# Patient Record
Sex: Male | Born: 1995 | Race: Black or African American | Hispanic: No | Marital: Single | Smoking: Never smoker
Health system: Southern US, Community
[De-identification: ages and names within clinical notes are randomized; demographics above are authoritative.]

## PROBLEM LIST (undated history)

## (undated) DIAGNOSIS — T7840XA Allergy, unspecified, initial encounter: Secondary | ICD-10-CM

## (undated) DIAGNOSIS — B019 Varicella without complication: Secondary | ICD-10-CM

## (undated) DIAGNOSIS — B029 Zoster without complications: Secondary | ICD-10-CM

## (undated) HISTORY — DX: Allergy, unspecified, initial encounter: T78.40XA

## (undated) HISTORY — PX: TONSILLECTOMY: SUR1361

---
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
## 2013-09-05 ENCOUNTER — Emergency Department: Payer: Self-pay | Admitting: Emergency Medicine

## 2014-07-08 ENCOUNTER — Emergency Department: Payer: Self-pay | Admitting: Emergency Medicine

## 2015-01-04 ENCOUNTER — Encounter: Payer: Self-pay | Admitting: Family Medicine

## 2015-01-04 ENCOUNTER — Ambulatory Visit (INDEPENDENT_AMBULATORY_CARE_PROVIDER_SITE_OTHER): Payer: BLUE CROSS/BLUE SHIELD | Admitting: Family Medicine

## 2015-01-04 VITALS — BP 108/82 | HR 67 | Temp 98.6°F | Resp 16 | Ht 70.5 in | Wt 154.6 lb

## 2015-01-04 DIAGNOSIS — M79671 Pain in right foot: Secondary | ICD-10-CM

## 2015-01-04 DIAGNOSIS — M25571 Pain in right ankle and joints of right foot: Secondary | ICD-10-CM | POA: Diagnosis not present

## 2015-01-04 DIAGNOSIS — Z23 Encounter for immunization: Secondary | ICD-10-CM | POA: Insufficient documentation

## 2015-01-04 NOTE — Progress Notes (Signed)
Name: Alan Pugh   MRN: [id number]    DOB: 01-Feb-1996   Date:01/04/2015       Progress Note  Subjective  Chief Complaint  Chief Complaint  Patient presents with  . Establish Care  . Foot Pain    patient plays basketball and has noticed some pain near the right ankle for the past 2 months. Patient has not noticed any reddness, warmth nor swelling.  Patient has not tried any medications for pain.    HPI  Joint/Muscle Pain: Patient complains of ankle and foot pain for which has been present for several months. Pain is located in right ankle and foot, is described as aching and shooting, and is intermittent, mild .  Associated symptoms include: none.  The patient has tried nothing for pain relief.  Related to injury:   No. Noticed while playing pick up basket ball about 3x/week but it does not inhibit him from playing. No instability, no previous injury.    Social History  Substance Use Topics  . Smoking status: Never Smoker   . Smokeless tobacco: Not on file  . Alcohol Use: No    No current outpatient prescriptions on file.  History reviewed. No pertinent past surgical history.  History reviewed. No pertinent family history.  No Known Allergies   Review of Systems  CONSTITUTIONAL: No significant weight changes, fever, chills, weakness or fatigue.  HEENT:  - Eyes: No visual changes.  - Ears: No auditory changes. No pain.  - Nose: No sneezing, congestion, runny nose. - Throat: No sore throat. No changes in swallowing. SKIN: No rash or itching.  CARDIOVASCULAR: No chest pain, chest pressure or chest discomfort. No palpitations or edema.  RESPIRATORY: No shortness of breath, cough or sputum.  GASTROINTESTINAL: No anorexia, nausea, vomiting. No changes in bowel habits. No abdominal pain or blood.  GENITOURINARY: No dysuria. No frequency. No discharge. NEUROLOGICAL: No headache, dizziness, syncope, paralysis, ataxia, numbness or tingling in the extremities. No memory  changes. No change in bowel or bladder control.  MUSCULOSKELETAL: Yes ankle and foot joint pain. No muscle pain. HEMATOLOGIC: No anemia, bleeding or bruising.  LYMPHATICS: No enlarged lymph nodes.  PSYCHIATRIC: No change in mood. No change in sleep pattern.  ENDOCRINOLOGIC: No reports of sweating, cold or heat intolerance. No polyuria or polydipsia.     Objective  BP 108/82 mmHg  Pulse 67  Temp(Src) 98.6 F (37 C) (Oral)  Resp 16  Ht 5' 10.5" (1.791 m)  Wt 154 lb 9.6 oz (70.126 kg)  BMI 21.86 kg/m2  SpO2 98% Body mass index is 21.86 kg/(m^2).  Physical Exam  Constitutional: Patient appears well-developed and well-nourished. In no distress.  Neck: Normal range of motion. Neck supple. No JVD present. No thyromegaly present.  Cardiovascular: Normal rate, regular rhythm and normal heart sounds.  No murmur heard.  Pulmonary/Chest: Effort normal and breath sounds normal. No respiratory distress. Musculoskeletal: Normal range of motion bilateral UE and LE, no joint effusions.  Bilateral ankles and feet examined with no significant findings.   Peripheral vascular: Bilateral LE no edema. Neurological: CN II-XII grossly intact with no focal deficits. Alert and oriented to person, place, and time. Coordination, balance, strength, speech and gait are normal.  Skin: Skin is warm and dry. No rash noted. No erythema.  Psychiatric: Patient has a normal mood and affect. Behavior is normal in office today. Judgment and thought content normal in office today.   Assessment & Plan  1. Right foot pain No concerning findings, likely  laxity in ankle joint but will get x-ray to rule out bone abnormality.  Given instructions on home care, stretching, NSAID as needed, ice as needed.   - DG Foot Complete Right; Future  2. Right ankle pain No concerning findings, likely laxity in ankle joint but will get x-ray to rule out bone abnormality. Given instructions on home care, stretching, NSAID as needed,  ice as needed.    - DG Ankle Complete Right; Future  3. Need for immunization against influenza  - Flu Vaccine QUAD 36+ mos PF IM (Fluarix Quad PF)

## 2015-01-29 ENCOUNTER — Encounter: Payer: BLUE CROSS/BLUE SHIELD | Admitting: Family Medicine

## 2015-02-16 ENCOUNTER — Emergency Department
Admission: EM | Admit: 2015-02-16 | Discharge: 2015-02-16 | Disposition: A | Discharge status: Home / Self Care | Payer: BLUE CROSS/BLUE SHIELD | Attending: Emergency Medicine | Admitting: Emergency Medicine

## 2015-02-16 ENCOUNTER — Encounter: Payer: Self-pay | Admitting: Emergency Medicine

## 2015-02-16 DIAGNOSIS — L259 Unspecified contact dermatitis, unspecified cause: Secondary | ICD-10-CM

## 2015-02-16 DIAGNOSIS — R21 Rash and other nonspecific skin eruption: Secondary | ICD-10-CM | POA: Diagnosis present

## 2015-02-16 HISTORY — DX: Zoster without complications: B02.9

## 2015-02-16 HISTORY — DX: Varicella without complication: B01.9

## 2015-02-16 MED ORDER — CETIRIZINE HCL 10 MG PO TABS: 10.0000 mg | ORAL_TABLET | Freq: Every day | ORAL | Status: DC

## 2015-02-16 MED ORDER — TRIAMCINOLONE ACETONIDE 0.05 % EX OINT: 1.0000 "application " | TOPICAL_OINTMENT | Freq: Four times a day (QID) | CUTANEOUS | Status: DC

## 2015-02-16 NOTE — ED Provider Notes (Signed)
[facility] Emergency Department [facility] Note  ____________________________________________  Time seen: Approximately 7:13 AM  I have reviewed the triage vital signs and the nursing notes.   HISTORY  Chief Complaint Rash    HPI Alan Pugh is a 19 y.o. male this emergency department complaining of a rash 4 days. He states that the majority of the rash is located on the back of his right leg. He does endorse a rash on his left hip, left abdomen, and left axillary region. The rash is pruritic and described as red bumps. He denies any blistering, lesions, drainage, scabbing. No ocular or oral involvement. No shortness of breath. He reports that it is painful" after a scratching a lot." No other symptoms or complaints.   Past Medical History  Diagnosis Date  . 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 Shingles   . Chicken pox     Patient Active Problem List   Diagnosis Date Noted  . Right foot pain 01/04/2015  . Right ankle pain 01/04/2015  . Need for immunization against influenza 01/04/2015    History reviewed. No pertinent past surgical history.  Current Outpatient Rx  Name  Route  Sig  Dispense  Refill  . cetirizine (ZYRTEC) 10 MG tablet   Oral   Take 1 tablet (10 mg total) by mouth daily.   30 tablet   0   . TRIAMCINOLONE ACETONIDE, TOP, 0.05 % OINT   Apply externally   Apply 1 application topically 4 (four) times daily.   15 g   0     Allergies Review of patient's allergies indicates no known allergies.  No family history on file.  Social History Social History  Substance Use Topics  . Smoking status: Never Smoker   . Smokeless tobacco: None  . Alcohol Use: No    Review of Systems Constitutional: No fever/chills Eyes: No visual changes. ENT: No sore throat. Cardiovascular: Denies chest pain. Respiratory: Denies shortness of breath. Gastrointestinal: No abdominal pain.  No nausea, no vomiting.  No diarrhea.   No constipation. Genitourinary: Negative for dysuria. Musculoskeletal: Negative for back pain. Skin: Positive for rash. Neurological: Negative for headaches, focal weakness or numbness.  10-point ROS otherwise negative.  ____________________________________________   PHYSICAL EXAM:  VITAL SIGNS: ED Triage Vitals  Enc Vitals Group     BP 02/16/15 0710 123/78 mmHg     Pulse Rate 02/16/15 0710 60     Resp 02/16/15 0710 18     Temp 02/16/15 0710 98.2 F (36.8 C)     Temp src --      SpO2 02/16/15 0710 97 %     Weight 02/16/15 0710 170 lb (77.111 kg)     Height 02/16/15 0710  (1.778 m)     Head Cir --      Peak Flow --      Pain Score 02/16/15 0711 5     Pain Loc --      Pain Edu? --      Excl. in GC? --     Constitutional: Alert and oriented. Well appearing and in no acute distress. Eyes: Conjunctivae are normal. PERRL. EOMI. Head: Atraumatic. Nose: No congestion/rhinnorhea. Mouth/Throat: Mucous membranes are moist.  Oropharynx non-erythematous. Neck: No stridor.   Cardiovascular: Normal rate, regular rhythm. Grossly normal heart sounds.  Good peripheral circulation. Respiratory: Normal respiratory effort.  No retractions. Lungs CTAB. Gastrointestinal: Soft and nontender. No distention. No abdominal bruits. No CVA tenderness. Musculoskeletal: No  lower extremity tenderness nor edema.  No joint effusions. Neurologic:  Normal speech and language. No gross focal neurologic deficits are appreciated. No gait instability. Skin:  Skin is warm, dry and intact. Maculopapular rash noted to popliteal fossa area, left lateral hip, left lateral abdomen, left axillary region. Rash is erythematous. No vesicular lesions noted. No scabbing. The rash does not appear to be dermatomal in nature. Psychiatric: Mood and affect are normal. Speech and behavior are normal.  ____________________________________________   LABS (all labs ordered are listed, but only abnormal results are  displayed)  Labs Reviewed - No data to display ____________________________________________  EKG   ____________________________________________  RADIOLOGY   ____________________________________________   PROCEDURES  Procedure(s) performed: None  Critical Care performed: No  ____________________________________________   INITIAL IMPRESSION / ASSESSMENT AND PLAN / ED COURSE  Pertinent labs & imaging results that were available during my care of the patient were reviewed by me and considered in my medical decision making (see chart for details).  Patient is a 19 year old male who presents to the emergency department complaining of a rash 4 days. He endorses purulent rhinitis and "pain after scratching the area." The patient has a history of shingles and is concerned that this is a repeat of same. He advises that the outbreak is different than his previous operations. He has been present for 4 days. The patient's history, symptoms, and exam is not consistent with shingles and most consistent with contact dermatitis. Explained diagnosis to patient. He verbalized understanding. Discussed treatment plan with patient and he verbalized understanding of same. ____________________________________________   FINAL CLINICAL IMPRESSION(S) / ED DIAGNOSES  Final diagnoses:  Contact dermatitis      Racheal Patches, PA-C 02/16/15 2956  Arnaldo Natal, MD 02/16/15 [phone]

## 2015-02-16 NOTE — Discharge Instructions (Signed)

## 2015-02-16 NOTE — ED Notes (Signed)
Patient to ER for c/o rash down right leg. States it looks like previous shingle rash. Denies any drainage at this time.

## 2016-02-14 ENCOUNTER — Emergency Department
Admission: EM | Admit: 2016-02-14 | Discharge: 2016-02-14 | Disposition: A | Discharge status: Home / Self Care | Payer: BLUE CROSS/BLUE SHIELD | Attending: Emergency Medicine | Admitting: Emergency Medicine

## 2016-02-14 ENCOUNTER — Emergency Department: Payer: BLUE CROSS/BLUE SHIELD

## 2016-02-14 ENCOUNTER — Encounter: Payer: Self-pay | Admitting: Emergency Medicine

## 2016-02-14 DIAGNOSIS — S60111A Contusion of right thumb with damage to nail, initial encounter: Secondary | ICD-10-CM | POA: Diagnosis not present

## 2016-02-14 DIAGNOSIS — Y92009 Unspecified place in unspecified non-institutional (private) residence as the place of occurrence of the external cause: Secondary | ICD-10-CM | POA: Insufficient documentation

## 2016-02-14 DIAGNOSIS — Y9367 Activity, basketball: Secondary | ICD-10-CM | POA: Insufficient documentation

## 2016-02-14 DIAGNOSIS — S6991XA Unspecified injury of right wrist, hand and finger(s), initial encounter: Secondary | ICD-10-CM | POA: Diagnosis present

## 2016-02-14 DIAGNOSIS — W010XXA Fall on same level from slipping, tripping and stumbling without subsequent striking against object, initial encounter: Secondary | ICD-10-CM | POA: Insufficient documentation

## 2016-02-14 DIAGNOSIS — Y998 Other external cause status: Secondary | ICD-10-CM | POA: Diagnosis not present

## 2016-02-14 DIAGNOSIS — S60011A Contusion of right thumb without damage to nail, initial encounter: Secondary | ICD-10-CM

## 2016-02-14 MED ORDER — NAPROXEN 500 MG PO TABS: 500.0000 mg | ORAL_TABLET | Freq: Two times a day (BID) | ORAL | 0 refills | Status: DC

## 2016-02-14 NOTE — ED Notes (Signed)
Patient to radiology at this time for ordered film of RIGHT thumb.

## 2016-02-14 NOTE — ED Notes (Signed)
FIRST NURSE NOTE: Patient presents with c/o RIGHT thumb pain. Patient states, "I think that I sprained it". Patient with pain and swelling s/p fall. Film ordered prior to triage.

## 2016-02-14 NOTE — ED Triage Notes (Signed)
Pt to ED via POV for RT 1st digit injury. Pt states he fell on his hand at home, pt has notable swelling and bleeding under nail bed. Pt unable to bend finger. Pt denies any other injuries.

## 2016-02-14 NOTE — ED Notes (Signed)
See triage note.

## 2016-02-14 NOTE — ED Provider Notes (Signed)
[facility] Emergency Department Provider Note  ____________________________________________  Time seen: Approximately 8:27 PM  I have reviewed the triage vital signs and the nursing notes.   HISTORY  Chief Complaint Finger Injury    HPI Alan MillerJustin R Pugh is a 20 y.o. male , NAD, presents to the emergency department accompanied by his mother who assists with history. Patient states that he tripped over a table and fell onto his right hand injuring his thumb. States that he has had swelling about the thumb since that time. Has not taken any medications for pain nor completed supportive care. Has not noted any open wounds or bleeding. No previous injuries to this hand or fingers. Denies any numbness, weakness, tingling. Denies head injury, LOC, visual changes to cause the fall. Has no neck or back pain.   Past Medical History:  Diagnosis Date  . 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 Chicken pox   . Shingles     Patient Active Problem List   Diagnosis Date Noted  . Right foot pain 01/04/2015  . Right ankle pain 01/04/2015  . Need for immunization against influenza 01/04/2015    Past Surgical History:  Procedure Laterality Date  . TONSILLECTOMY      Prior to Admission medications   Medication Sig Start Date End Date Taking? Authorizing Provider  cetirizine (ZYRTEC) 10 MG tablet Take 1 tablet (10 mg total) by mouth daily. 02/16/15   Delorise RoyalsJonathan D Cuthriell, PA-C  naproxen (NAPROSYN) 500 MG tablet Take 1 tablet (500 mg total) by mouth 2 (two) times daily with a meal. 02/14/16   Shellia Hartl L Bryten Maher, PA-C  TRIAMCINOLONE ACETONIDE, TOP, 0.05 % OINT Apply 1 application topically 4 (four) times daily. 02/16/15   Delorise RoyalsJonathan D Cuthriell, PA-C    Allergies Review of patient's allergies indicates no known allergies.  No family history on file.  Social History Social History  Substance Use Topics  . Smoking status: Never Smoker  . Smokeless tobacco: Never Used   . Alcohol use No     Review of Systems  Constitutional: No Fatigue Eyes: No visual changes.  Musculoskeletal: Positive right thumb pain. Negative for hand, back, Neck pain.  Skin: Positive swelling right thumb. Positive bruising right thumbnail. Negative for rash, open wounds. Neurological: Negative for numbness, weakness, tingling. No LOC, lightheadedness. 10-point ROS otherwise negative.  ____________________________________________   PHYSICAL EXAM:  VITAL SIGNS: ED Triage Vitals  Enc Vitals Group     BP 02/14/16 1959 115/64     Pulse Rate 02/14/16 1959 91     Resp 02/14/16 1959 16     Temp 02/14/16 1959 98.4 F (36.9 C)     Temp Source 02/14/16 1959 Oral     SpO2 02/14/16 1959 95 %     Weight 02/14/16 2000 165 lb (74.8 kg)     Height 02/14/16 2000 5\' 10"  (1.778 m)     Head Circumference --      Peak Flow --      Pain Score 02/14/16 2001 3     Pain Loc --      Pain Edu? --      Excl. in GC? --      Constitutional: Alert and oriented. Well appearing and in no acute distress. Eyes: Conjunctivae are normal.  Head: Atraumatic. Cardiovascular: Good peripheral circulation with 2+ pulses noted in the right upper extremity. Capillary refill is brisk in all digits of the right hand. Respiratory: Normal respiratory effort without tachypnea or retractions.  Musculoskeletal: Tenderness to palpation diffusely about the right thumb without joint swelling or crepitus. Full passive range of motion of the right thumb with moderate pain. Neurologic:  Normal speech and language. No gross focal neurologic deficits are appreciated.  Skin:  Skin is warm, dry and intact. No rash noted. Subungual hematoma noted about the right thumb. No open wounds or skin sores are noted. No active bleeding, oozing or weeping. Psychiatric: Mood and affect are normal. Speech and behavior are normal for age   ____________________________________________    LABS  None ____________________________________________  EKG  None ____________________________________________  RADIOLOGY I have personally viewed and evaluated these images (plain radiographs) as part of my medical decision making, as well as reviewing the written report by the radiologist.  Dg Finger Thumb Right  Result Date: 02/14/2016 CLINICAL DATA:  20 year old who fell and injured the right thumb earlier today while playing basketball. Swelling and bruising under the nail. Initial encounter. EXAM: RIGHT THUMB 2+V COMPARISON:  None. FINDINGS: No evidence of acute fracture or dislocation. Joint spaces well preserved. Well-preserved bone mineral density. No intrinsic osseous abnormalities. IMPRESSION: Normal examination. Electronically Signed   By: Hulan Saas M.D.   On: 02/14/2016 19:46    ____________________________________________    PROCEDURES  Procedure(s) performed: None   Procedures   Medications - No data to display   ____________________________________________   INITIAL IMPRESSION / ASSESSMENT AND PLAN / ED COURSE  Pertinent labs & imaging results that were available during my care of the patient were reviewed by me and considered in my medical decision making (see chart for details).  Clinical Course    Patient's diagnosis is consistent with Contusion of right thumb with contusion of the nail. Patient will be discharged home with prescriptions for naproxen to take as directed. Patient is to apply ice to the affected area 20 minutes 3-4 times daily and complete range of motion exercises as tolerated. Patient is to follow up with his primary care provider or [facility] if symptoms persist past this treatment course. Patient is given ED precautions to return to the ED for any worsening or new symptoms.    ____________________________________________  FINAL CLINICAL IMPRESSION(S) / ED DIAGNOSES  Final diagnoses:  Contusion of right  thumb, initial encounter  Contusion of right thumb nail, initial encounter      NEW MEDICATIONS STARTED DURING THIS VISIT:  Discharge Medication List as of 02/14/2016  8:32 PM    START taking these medications   Details  naproxen (NAPROSYN) 500 MG tablet Take 1 tablet (500 mg total) by mouth 2 (two) times daily with a meal., Starting Thu 02/14/2016, Print             Ernestene Kiel Montrose, PA-C 02/14/16 2149    Emily Filbert, MD 02/14/16 2227

## 2017-02-09 ENCOUNTER — Encounter: Payer: Self-pay | Admitting: Emergency Medicine

## 2017-02-09 ENCOUNTER — Emergency Department
Admission: EM | Admit: 2017-02-09 | Discharge: 2017-02-10 | Disposition: A | Discharge status: Home / Self Care | Payer: BLUE CROSS/BLUE SHIELD | Attending: Emergency Medicine | Admitting: Emergency Medicine

## 2017-02-09 DIAGNOSIS — R1012 Left upper quadrant pain: Secondary | ICD-10-CM | POA: Diagnosis present

## 2017-02-09 DIAGNOSIS — K2900 Acute gastritis without bleeding: Secondary | ICD-10-CM

## 2017-02-09 LAB — CBC
HEMATOCRIT: 42.7 % (ref 40.0–52.0)
Hemoglobin: 14.1 g/dL (ref 13.0–18.0)
MCH: 25.8 pg — AB (ref 26.0–34.0)
MCHC: 32.9 g/dL (ref 32.0–36.0)
MCV: 78.5 fL — AB (ref 80.0–100.0)
Platelets: 145 10*3/uL — ABNORMAL LOW (ref 150–440)
RBC: 5.45 MIL/uL (ref 4.40–5.90)
RDW: 15.6 % — AB (ref 11.5–14.5)
WBC: 4.6 10*3/uL (ref 3.8–10.6)

## 2017-02-09 LAB — COMPREHENSIVE METABOLIC PANEL
ALT: 28 U/L (ref 17–63)
AST: 35 U/L (ref 15–41)
Albumin: 4.3 g/dL (ref 3.5–5.0)
Alkaline Phosphatase: 63 U/L (ref 38–126)
Anion gap: 7 (ref 5–15)
BILIRUBIN TOTAL: 0.9 mg/dL (ref 0.3–1.2)
BUN: 12 mg/dL (ref 6–20)
CHLORIDE: 107 mmol/L (ref 101–111)
CO2: 26 mmol/L (ref 22–32)
Calcium: 9.2 mg/dL (ref 8.9–10.3)
Creatinine, Ser: 1.01 mg/dL (ref 0.61–1.24)
GFR calc Af Amer: >60 mL/min (ref >60)
GFR calc non Af Amer: >60 mL/min (ref >60)
GLUCOSE: 100 mg/dL — AB (ref 65–99)
POTASSIUM: 3.9 mmol/L (ref 3.5–5.1)
Sodium: 140 mmol/L (ref 135–145)
Total Protein: 7.5 g/dL (ref 6.5–8.1)

## 2017-02-09 LAB — LIPASE, BLOOD: Lipase: 33 U/L (ref 11–51)

## 2017-02-09 MED ORDER — SUCRALFATE 1 G PO TABS
1.0000 g | ORAL_TABLET | Freq: Once | ORAL | Status: AC
  Administered 2017-02-09: 1 g via ORAL
  Filled 2017-02-09: qty 1

## 2017-02-09 MED ORDER — SODIUM CHLORIDE 0.9 % IV BOLUS (SEPSIS)
1000.0000 mL | Freq: Once | INTRAVENOUS | Status: AC
  Administered 2017-02-09: 1000 mL via INTRAVENOUS

## 2017-02-09 MED ORDER — GI COCKTAIL ~~LOC~~
30.0000 mL | Freq: Once | ORAL | Status: AC
  Administered 2017-02-09: 30 mL via ORAL
  Filled 2017-02-09: qty 30

## 2017-02-09 NOTE — ED Triage Notes (Signed)
Patient presents to ED via POV from home with c/o generalized abdominal pain that began today. Patient moaning and guarding him abdomen in triage. Denies N/V/D.

## 2017-02-10 MED ORDER — FAMOTIDINE 40 MG PO TABS: 40.0000 mg | ORAL_TABLET | Freq: Every evening | ORAL | 0 refills | Status: DC

## 2017-02-10 MED ORDER — SUCRALFATE 1 G PO TABS: 1.0000 g | ORAL_TABLET | Freq: Two times a day (BID) | ORAL | 0 refills | Status: DC

## 2017-02-10 NOTE — ED Provider Notes (Signed)
[facility] Emergency Department Provider Note   ____________________________________________   First MD Initiated Contact with Patient 02/09/17 2332     (approximate)  I have reviewed the triage vital signs and the nursing notes.   HISTORY  Chief Complaint Abdominal Pain    HPI Alan Pugh is a 21 y.o. male who comes into the hospital today with some worsening abdominal pain. He reports it was an acute onset today. He came home from work around 4:30 to 5 PM and his abdomen started hurting. He states it was his mid abdomen and it worsened as the evening went on. The patient states that the pain moves to pending on what side he lays on and is worse when he lays down. The patient states he ate breakfast this morning and then after he came home he tried to eat some chicken strips and french fries. The patient was able to eat but did feel sick. He states the pain is deep and sharp and intermittent. Currently the patient's pain is a 5 out of 10 in intensity. He's had no vomiting or diarrhea. His last bowel movement was this morning. He's had some nausea and the urge to defecate.the patient has never had symptoms like this before. He did not take anything for pain home. He is here today for evaluation.   Past Medical History:  Diagnosis Date  . 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 Chicken pox   . Shingles     Patient Active Problem List   Diagnosis Date Noted  . Right foot pain 01/04/2015  . Right ankle pain 01/04/2015  . Need for immunization against influenza 01/04/2015    Past Surgical History:  Procedure Laterality Date  . TONSILLECTOMY      Prior to Admission medications   Medication Sig Start Date End Date Taking? Authorizing Provider  cetirizine (ZYRTEC) 10 MG tablet Take 1 tablet (10 mg total) by mouth daily. 02/16/15   Cuthriell, Delorise Royals, PA-C  famotidine (PEPCID) 40 MG tablet Take 1 tablet (40 mg total) by mouth every  evening. 02/10/17 02/10/18  Rebecka Apley, MD  naproxen (NAPROSYN) 500 MG tablet Take 1 tablet (500 mg total) by mouth 2 (two) times daily with a meal. 02/14/16   Hagler, Jami L, PA-C  sucralfate (CARAFATE) 1 g tablet Take 1 tablet (1 g total) by mouth 2 (two) times daily. 02/10/17   Rebecka Apley, MD  TRIAMCINOLONE ACETONIDE, TOP, 0.05 % OINT Apply 1 application topically 4 (four) times daily. 02/16/15   Cuthriell, Delorise Royals, PA-C    Allergies Patient has no known allergies.  No family history on file.  Social History Social History  Substance Use Topics  . Smoking status: Never Smoker  . Smokeless tobacco: Never Used  . Alcohol use No    Review of Systems  Constitutional: No fever/chills Eyes: No visual changes. ENT: No sore throat. Cardiovascular: Denies chest pain. Respiratory: Denies shortness of breath. Gastrointestinal:  abdominal pain.   nausea, no vomiting.  No diarrhea.  No constipation. Genitourinary: Negative for dysuria. Musculoskeletal: Negative for back pain. Skin: Negative for rash. Neurological: Negative for headaches, focal weakness or numbness.   ____________________________________________   PHYSICAL EXAM:  VITAL SIGNS: ED Triage Vitals  Enc Vitals Group     BP 02/09/17 2225 (!) 144/92     Pulse Rate 02/09/17 2225 (!) 57     Resp 02/09/17 2225 15     Temp 02/09/17 2225 98.7 F (  37.1 C)     Temp Source 02/09/17 2225 Oral     SpO2 02/09/17 2225 98 %     Weight 02/09/17 2225 165 lb (74.8 kg)     Height 02/09/17 2225  (1.803 m)     Head Circumference --      Peak Flow --      Pain Score 02/09/17 2224 7     Pain Loc --      Pain Edu? --      Excl. in GC? --     Constitutional: Alert and oriented. Well appearing and in moderate distress. Eyes: Conjunctivae are normal. PERRL. EOMI. Head: Atraumatic. Nose: No congestion/rhinnorhea. Mouth/Throat: Mucous membranes are moist.  Oropharynx non-erythematous. Cardiovascular: Normal rate,  regular rhythm. Grossly normal heart sounds.  Good peripheral circulation. Respiratory: Normal respiratory effort.  No retractions. Lungs CTAB. Gastrointestinal: Soft with some epigastric and left upper quadrant tenderness to palpation. No distention. positive bowel sounds Musculoskeletal: No lower extremity tenderness nor edema.   Neurologic:  Normal speech and language.  Skin:  Skin is warm, dry and intact. Marland Kitchen Psychiatric: Mood and affect are normal.   ____________________________________________   LABS (all labs ordered are listed, but only abnormal results are displayed)  Labs Reviewed  COMPREHENSIVE METABOLIC PANEL - Abnormal; Notable for the following:       Result Value   Glucose, Bld 100 (*)    All other components within normal limits  CBC - Abnormal; Notable for the following:    MCV 78.5 (*)    MCH 25.8 (*)    RDW 15.6 (*)    Platelets 145 (*)    All other components within normal limits  LIPASE, BLOOD  URINALYSIS, COMPLETE (UACMP) WITH MICROSCOPIC   ____________________________________________  EKG  none ____________________________________________  RADIOLOGY  No results found.  ____________________________________________   PROCEDURES  Procedure(s) performed: None  Procedures  Critical Care performed: No  ____________________________________________   INITIAL IMPRESSION / ASSESSMENT AND PLAN / ED COURSE  Pertinent labs & imaging results that were available during my care of the patient were reviewed by me and considered in my medical decision making (see chart for details).  this is a 21 year old male who comes into the hospital today with some abdominal pain. The pain started earlier today and has persisted. My differential diagnosis includes pancreatitis, gastritis, biliary colic versus cholecystitis. I did check some blood work on the patient and his lipase is unremarkable. The patient's blood work is unremarkable as well. I was more concerned  about gastritis given the fact that the patient did not have any right-sided abdominal pain and his blood work is unremarkable. I gave the patient liter of normal saline as well as a GI cocktail and Carafate. The patient states that after the medication his pain improved tremendously. I encouraged the patient to change his diet from fried fatty foods as well as cutting out tomato sauces, spicy foods and acidic foods. The patient should follow-up with his primary care physician for further evaluation. The patient has no further questions or concerns. He should return with any worsening condition or any other concerns.      ____________________________________________   FINAL CLINICAL IMPRESSION(S) / ED DIAGNOSES  Final diagnoses:  Acute gastritis without hemorrhage, unspecified gastritis type  Left upper quadrant pain      NEW MEDICATIONS STARTED DURING THIS VISIT:  Discharge Medication List as of 02/10/2017 12:43 AM    START taking these medications   Details  famotidine (PEPCID) 40 MG  tablet Take 1 tablet (40 mg total) by mouth every evening., Starting Tue 02/10/2017, Until Wed 02/10/2018, Print    sucralfate (CARAFATE) 1 g tablet Take 1 tablet (1 g total) by mouth 2 (two) times daily., Starting Tue 02/10/2017, Print         Note:  This document was prepared using Dragon voice recognition software and may include unintentional dictation errors.    Rebecka Apley, MD 02/10/17 [id number]

## 2017-02-10 NOTE — ED Notes (Signed)
Pt reports decrease in pain. Fluids infusing.

## 2017-02-10 NOTE — ED Notes (Signed)
Patient is resting comfortably. 

## 2017-02-10 NOTE — Discharge Instructions (Signed)
Please follow up with your primary care physician. Please refrain from eating greasy, spicy, acidic, tomato based foods and drinks. Please return with any worsening symptoms or any other concerns.

## 2017-02-10 NOTE — ED Notes (Signed)
Family at bedside. 

## 2017-02-25 IMAGING — CR DG FINGER THUMB 2+V*R*
1 series · 3 of 3 positions shown · non-contrast
Comparison: None.

CLINICAL DATA: 19-year-old who fell and injured the right thumb
earlier today while playing basketball. Swelling and bruising under
the nail. Initial encounter.

EXAM:
RIGHT THUMB 2+V

[Series 1: x finger pa right · 0.14mm/px · 3 of 3 slices shown]
[im 1/3]
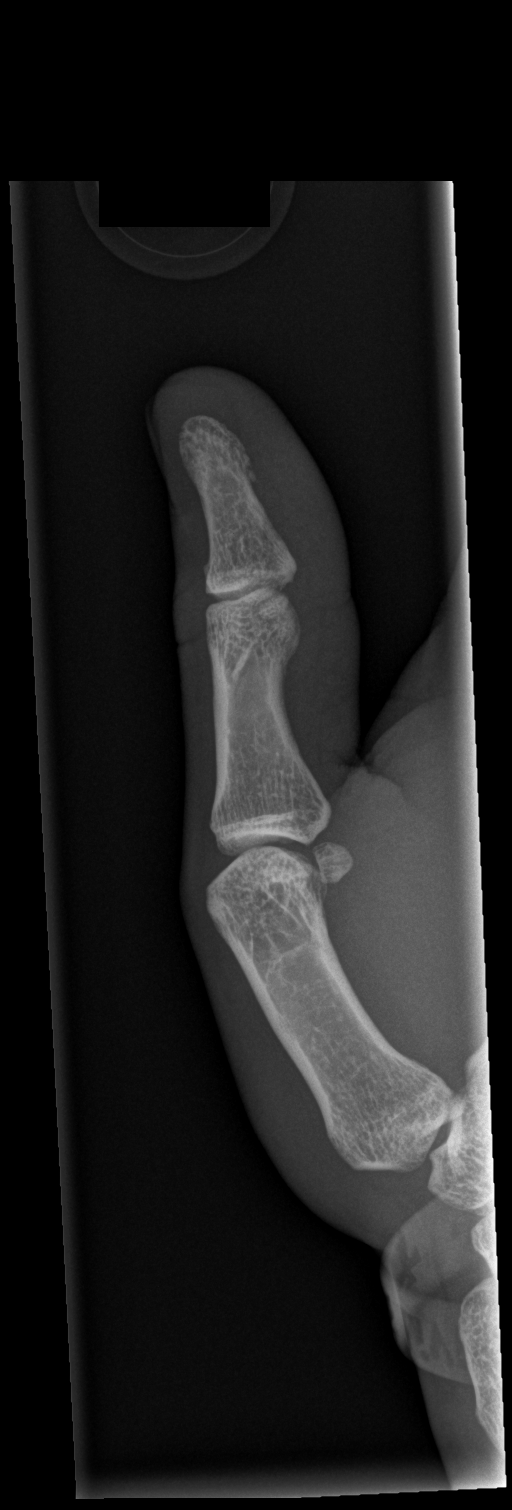
[im 2/3]
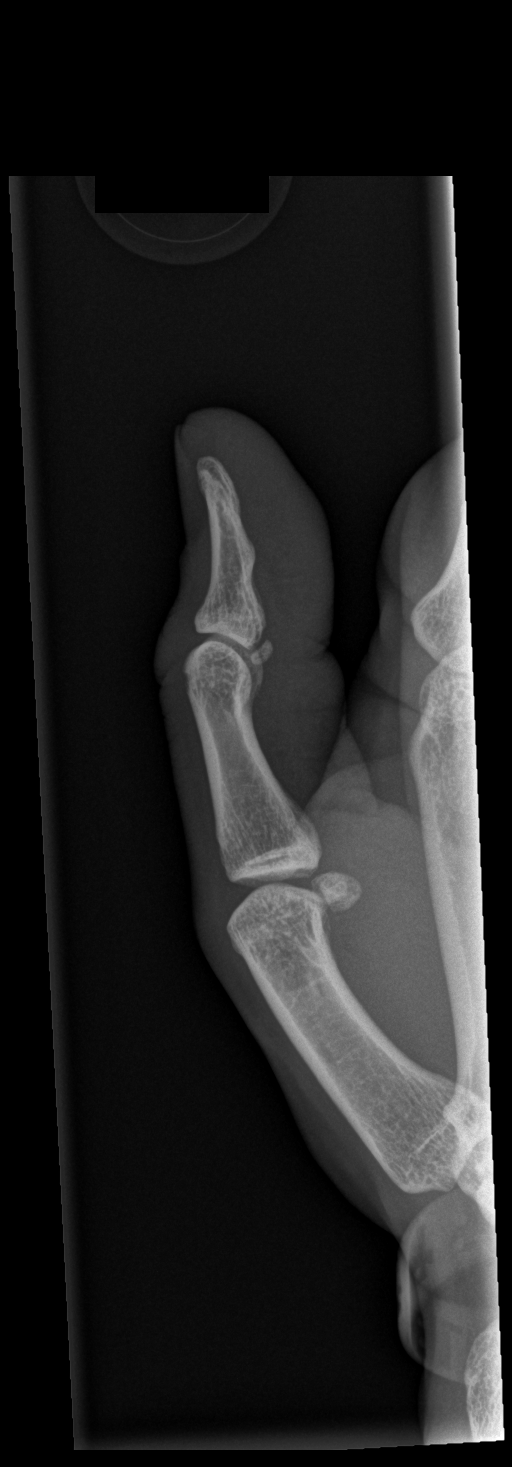
[im 3/3]
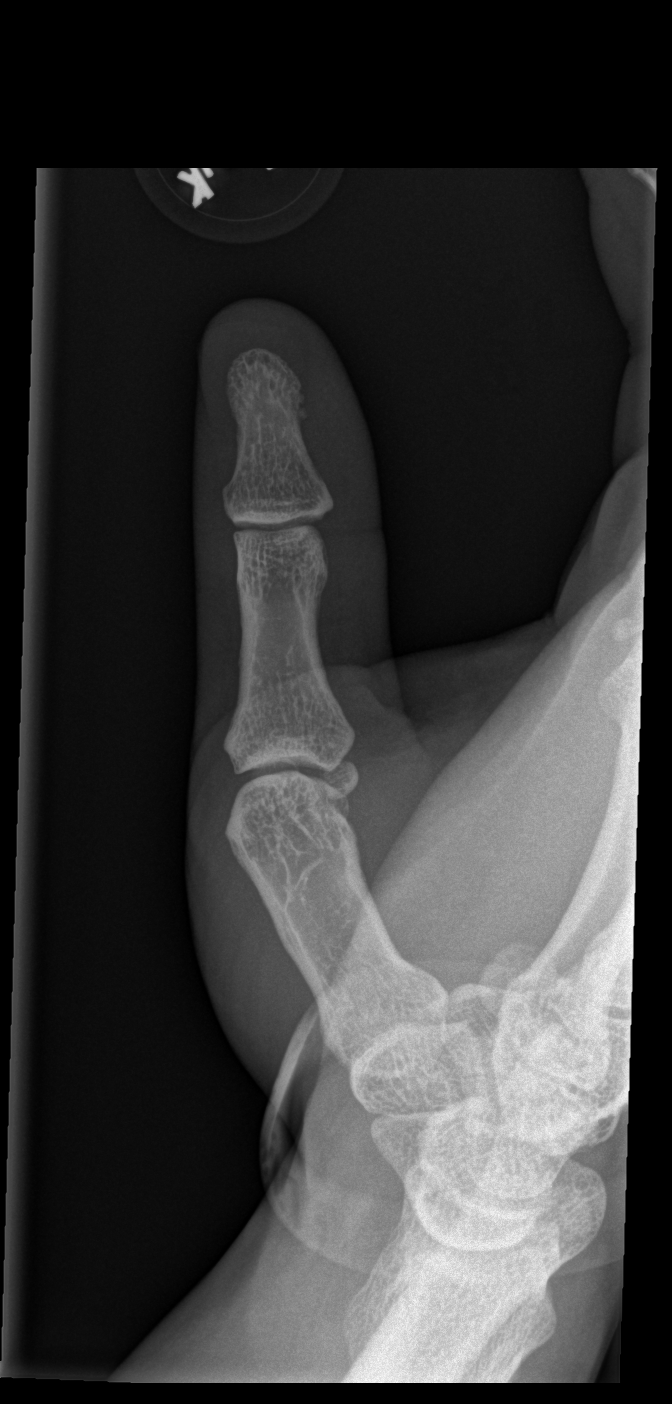

[3 of 3 positions shown; findings below may reference images not displayed]

FINDINGS: No evidence of acute fracture or dislocation. Joint spaces well
preserved. Well-preserved bone mineral density. No intrinsic osseous
abnormalities.
IMPRESSION: Normal examination.

## 2017-04-13 LAB — HM HIV SCREENING LAB: HM HIV Screening: NEGATIVE

## 2018-01-29 ENCOUNTER — Other Ambulatory Visit: Payer: Self-pay

## 2018-01-29 ENCOUNTER — Emergency Department
Admission: EM | Admit: 2018-01-29 | Discharge: 2018-01-29 | Disposition: A | Discharge status: Home / Self Care | Payer: BLUE CROSS/BLUE SHIELD | Attending: Emergency Medicine | Admitting: Emergency Medicine

## 2018-01-29 ENCOUNTER — Encounter: Payer: Self-pay | Admitting: Emergency Medicine

## 2018-01-29 DIAGNOSIS — X58XXXD Exposure to other specified factors, subsequent encounter: Secondary | ICD-10-CM | POA: Insufficient documentation

## 2018-01-29 DIAGNOSIS — S91209A Unspecified open wound of unspecified toe(s) with damage to nail, initial encounter: Secondary | ICD-10-CM

## 2018-01-29 DIAGNOSIS — S90211D Contusion of right great toe with damage to nail, subsequent encounter: Secondary | ICD-10-CM | POA: Diagnosis present

## 2018-01-29 MED ORDER — LIDOCAINE HCL (PF) 1 % IJ SOLN: 5.0000 mL | Freq: Once | INTRAMUSCULAR | Status: DC

## 2018-01-29 MED ORDER — LIDOCAINE HCL (PF) 1 % IJ SOLN
INTRAMUSCULAR | Status: AC
  Filled 2018-01-29: qty 5

## 2018-01-29 MED ORDER — BACITRACIN-NEOMYCIN-POLYMYXIN 400-5-5000 EX OINT
TOPICAL_OINTMENT | CUTANEOUS | Status: AC
  Filled 2018-01-29: qty 1

## 2018-01-29 NOTE — Discharge Instructions (Signed)
You have had your partially torn nail removed. Keep the wound bed clean, dry, and covered until it heals. Follow-up with your provider or podiatry for further management.

## 2018-01-29 NOTE — ED Triage Notes (Signed)
Right shoulder injury, c/o  Right shoulder pain x 2 weeks.

## 2018-01-31 NOTE — ED Provider Notes (Signed)
[facility] Emergency Department Provider Note ____________________________________________  Time seen: 1808  I have reviewed the triage vital signs and the nursing notes.  HISTORY  Chief Complaint  Toe Pain  HPI Alan MillerJustin R Pugh is a 22 y.o. male who presents himself to the ED for evaluation of comfort of the right great toe.  Patient describes about 3 weeks earlier he dropped a large can of beans onto his right great toe.  He describes a hematoma develop under the toenail and then about a week later the edge of the nail on the medial side separated from the cuticle.  Since that time he has had the toenail lateral edge exposed, and is at risk now for the toenail snagging normal activity.  He is presenting for further evaluation and management.  Past Medical History:  Diagnosis Date  . 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 Chicken pox   . Shingles     Patient Active Problem List   Diagnosis Date Noted  . Right foot pain 01/04/2015  . Right ankle pain 01/04/2015  . Need for immunization against influenza 01/04/2015    Past Surgical History:  Procedure Laterality Date  . TONSILLECTOMY      Prior to Admission medications   Medication Sig Start Date End Date Taking? Authorizing Provider  cetirizine (ZYRTEC) 10 MG tablet Take 1 tablet (10 mg total) by mouth daily. 02/16/15   Cuthriell, Delorise RoyalsJonathan D, PA-C  famotidine (PEPCID) 40 MG tablet Take 1 tablet (40 mg total) by mouth every evening. 02/10/17 02/10/18  Rebecka ApleyWebster, Allison P, MD  naproxen (NAPROSYN) 500 MG tablet Take 1 tablet (500 mg total) by mouth 2 (two) times daily with a meal. 02/14/16   Hagler, Jami L, PA-C  sucralfate (CARAFATE) 1 g tablet Take 1 tablet (1 g total) by mouth 2 (two) times daily. 02/10/17   Rebecka ApleyWebster, Allison P, MD  TRIAMCINOLONE ACETONIDE, TOP, 0.05 % OINT Apply 1 application topically 4 (four) times daily. 02/16/15   Cuthriell, Delorise RoyalsJonathan D, PA-C    Allergies Patient has no known  allergies.  History reviewed. No pertinent family history.  Social History Social History   Tobacco Use  . Smoking status: Never Smoker  . Smokeless tobacco: Never Used  Substance Use Topics  . Alcohol use: No    Alcohol/week: 0.0 standard drinks  . Drug use: No    Review of Systems  Constitutional: Negative for fever. Cardiovascular: Negative for chest pain. Respiratory: Negative for shortness of breath. Musculoskeletal: Negative for back pain. Skin: Negative for rash. Right great toe nail avulsion as above Neurological: Negative for headaches, focal weakness or numbness. ____________________________________________  PHYSICAL EXAM:  VITAL SIGNS: ED Triage Vitals  Enc Vitals Group     BP 01/29/18 1752 106/63     Pulse Rate 01/29/18 1752 73     Resp 01/29/18 1752 18     Temp 01/29/18 1752 98.3 F (36.8 C)     Temp Source 01/29/18 1752 Oral     SpO2 01/29/18 1752 96 %     Weight 01/29/18 1739 164 lb 14.5 oz (74.8 kg)     Height --      Head Circumference --      Peak Flow --      Pain Score 01/29/18 1739 5     Pain Loc --      Pain Edu? --      Excl. in GC? --     Constitutional: Alert and oriented. Well appearing  and in no distress. Head: Normocephalic and atraumatic. Eyes: Conjunctivae are normal. PERRL. Normal extraocular movements Respiratory: Normal respiratory effort. No wheezes/rales/rhonchi. Musculoskeletal: Nontender with normal range of motion in all extremities.  Neurologic:  Normal gait without ataxia. Normal speech and language. No gross focal neurologic deficits are appreciated. Skin:  Skin is warm, dry and intact. No rash noted.  Right great toenail with a partial avulsion noted.  The great nail is only attached on the medial aspect.  The lateral aspect of the nail is avulsed, separated, and the nailbed is intact without acute laceration or subungual hematoma.  There is no disruption of the nail matrix from the  cuticle. ____________________________________________  PROCEDURES  .Nail Removal Date/Time: 01/31/2018 12:03 AM Performed by: Lissa Hoard, PA-C Authorized by: Lissa Hoard, PA-C   Consent:    Consent obtained:  Verbal   Consent given by:  Patient   Risks discussed:  Incomplete removal, bleeding and pain   Alternatives discussed:  Referral Location:    Foot:  R big toe Pre-procedure details:    Skin preparation:  Alcohol Anesthesia (see MAR for exact dosages):    Anesthesia method:  Nerve block   Block location:  Great toe   Block needle gauge:  27 G   Block anesthetic:  Lidocaine 1% w/o epi   Block injection procedure:  Anatomic landmarks palpated, introduced needle and incremental injection   Block outcome:  Anesthesia achieved Nail Removal:    Nail removed:  Partial   Nail side:  Medial   Nail bed repaired: no     Removed nail replaced and anchored: no   Trephination:    Subungual hematoma drained: no   Post-procedure details:    Dressing:  Antibiotic ointment and 4x4 sterile gauze   Patient tolerance of procedure:  Tolerated well, no immediate complications  ____________________________________________  INITIAL IMPRESSION / ASSESSMENT AND PLAN / ED COURSE  Patient with ED evaluation of a traumatic partial nail avulsion 3 weeks status post nail contusion with subsequent subungual hematoma.  He presents now with the lateral aspect of the right great toenail partially avulsed and separated from the nailbed.  The remainder of the attached distal nail portion is separated from the nailbed and removed.  A portion of the nail growing normally from the cuticle was left in place.  The nailbed is covered with antibiotic ointment and nonstick gauze.  Patient will follow-up with podiatry for ongoing wound care management. ____________________________________________  FINAL CLINICAL IMPRESSION(S) / ED DIAGNOSES  Final diagnoses:  Traumatic avulsion of nail  plate of toe, initial encounter      Lissa Hoard, PA-C 01/31/18 0008    Loleta Rose, MD 01/31/18 [phone]

## 2018-04-16 ENCOUNTER — Other Ambulatory Visit: Payer: Self-pay

## 2018-04-16 ENCOUNTER — Emergency Department
Admission: EM | Admit: 2018-04-16 | Discharge: 2018-04-16 | Payer: BLUE CROSS/BLUE SHIELD | Attending: Emergency Medicine | Admitting: Emergency Medicine

## 2018-04-16 ENCOUNTER — Emergency Department: Payer: BLUE CROSS/BLUE SHIELD

## 2018-04-16 ENCOUNTER — Encounter: Payer: Self-pay | Admitting: Emergency Medicine

## 2018-04-16 DIAGNOSIS — S20211A Contusion of right front wall of thorax, initial encounter: Secondary | ICD-10-CM | POA: Diagnosis not present

## 2018-04-16 DIAGNOSIS — Y9389 Activity, other specified: Secondary | ICD-10-CM | POA: Diagnosis not present

## 2018-04-16 DIAGNOSIS — Y999 Unspecified external cause status: Secondary | ICD-10-CM | POA: Diagnosis not present

## 2018-04-16 DIAGNOSIS — S5001XA Contusion of right elbow, initial encounter: Secondary | ICD-10-CM | POA: Diagnosis not present

## 2018-04-16 DIAGNOSIS — S299XXA Unspecified injury of thorax, initial encounter: Secondary | ICD-10-CM | POA: Diagnosis present

## 2018-04-16 DIAGNOSIS — Y92410 Unspecified street and highway as the place of occurrence of the external cause: Secondary | ICD-10-CM | POA: Insufficient documentation

## 2018-04-16 MED ORDER — IBUPROFEN 400 MG PO TABS
600.0000 mg | ORAL_TABLET | Freq: Once | ORAL | Status: AC
  Administered 2018-04-16: 600 mg via ORAL
  Filled 2018-04-16: qty 2
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
## 2018-04-16 NOTE — Discharge Instructions (Addendum)
Return to the ER for new, worsening, persistent severe pain, difficulty breathing, or any other new or worsening symptoms that concern you.
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
## 2018-05-31 ENCOUNTER — Encounter: Payer: Self-pay | Admitting: Emergency Medicine

## 2018-05-31 ENCOUNTER — Emergency Department
Admission: EM | Admit: 2018-05-31 | Discharge: 2018-05-31 | Disposition: A | Discharge status: Other Acute Inpatient Hospital | Payer: BLUE CROSS/BLUE SHIELD | Source: Home / Self Care | Attending: Student in an Organized Health Care Education/Training Program | Admitting: Student in an Organized Health Care Education/Training Program

## 2018-05-31 ENCOUNTER — Encounter: Payer: Self-pay | Admitting: Psychiatry

## 2018-05-31 ENCOUNTER — Inpatient Hospital Stay
Admission: AD | Admit: 2018-05-31 | Discharge: 2018-06-02 | DRG: 885 | Disposition: A | Discharge status: Home / Self Care | Payer: BLUE CROSS/BLUE SHIELD | Source: Intra-hospital | Attending: Psychiatry | Admitting: Psychiatry

## 2018-05-31 ENCOUNTER — Other Ambulatory Visit: Payer: Self-pay

## 2018-05-31 DIAGNOSIS — X838XXA Intentional self-harm by other specified means, initial encounter: Secondary | ICD-10-CM | POA: Diagnosis present

## 2018-05-31 DIAGNOSIS — R45851 Suicidal ideations: Secondary | ICD-10-CM | POA: Insufficient documentation

## 2018-05-31 DIAGNOSIS — F322 Major depressive disorder, single episode, severe without psychotic features: Secondary | ICD-10-CM | POA: Diagnosis present

## 2018-05-31 DIAGNOSIS — F909 Attention-deficit hyperactivity disorder, unspecified type: Secondary | ICD-10-CM | POA: Diagnosis present

## 2018-05-31 DIAGNOSIS — Z79899 Other long term (current) drug therapy: Secondary | ICD-10-CM | POA: Insufficient documentation

## 2018-05-31 DIAGNOSIS — Z560 Unemployment, unspecified: Secondary | ICD-10-CM | POA: Diagnosis not present

## 2018-05-31 DIAGNOSIS — F329 Major depressive disorder, single episode, unspecified: Secondary | ICD-10-CM

## 2018-05-31 DIAGNOSIS — T1491XA Suicide attempt, initial encounter: Secondary | ICD-10-CM

## 2018-05-31 LAB — CBC WITH DIFFERENTIAL/PLATELET
Abs Immature Granulocytes: 0.01 10*3/uL (ref 0.00–0.07)
BASOS ABS: 0 10*3/uL (ref 0.0–0.1)
Basophils Relative: 1 %
EOS PCT: 5 %
Eosinophils Absolute: 0.2 10*3/uL (ref 0.0–0.5)
HEMATOCRIT: 42.6 % (ref 39.0–52.0)
HEMOGLOBIN: 13 g/dL (ref 13.0–17.0)
IMMATURE GRANULOCYTES: 0 %
LYMPHS ABS: 1.4 10*3/uL (ref 0.7–4.0)
LYMPHS PCT: 38 %
MCH: 24.8 pg — ABNORMAL LOW (ref 26.0–34.0)
MCHC: 30.5 g/dL (ref 30.0–36.0)
MCV: 81.3 fL (ref 80.0–100.0)
Monocytes Absolute: 0.4 10*3/uL (ref 0.1–1.0)
Monocytes Relative: 11 %
NEUTROS ABS: 1.6 10*3/uL — AB (ref 1.7–7.7)
NEUTROS PCT: 45 %
NRBC: 0 % (ref 0.0–0.2)
Platelets: 163 10*3/uL (ref 150–400)
RBC: 5.24 MIL/uL (ref 4.22–5.81)
RDW: 14.5 % (ref 11.5–15.5)
WBC: 3.6 10*3/uL — AB (ref 4.0–10.5)

## 2018-05-31 LAB — SALICYLATE LEVEL

## 2018-05-31 LAB — COMPREHENSIVE METABOLIC PANEL
ALBUMIN: 4.2 g/dL (ref 3.5–5.0)
ALK PHOS: 65 U/L (ref 38–126)
ALT: 52 U/L — ABNORMAL HIGH (ref 0–44)
ANION GAP: 4 — AB (ref 5–15)
AST: 57 U/L — ABNORMAL HIGH (ref 15–41)
BILIRUBIN TOTAL: 0.6 mg/dL (ref 0.3–1.2)
BUN: 10 mg/dL (ref 6–20)
CO2: 27 mmol/L (ref 22–32)
Calcium: 9.6 mg/dL (ref 8.9–10.3)
Chloride: 110 mmol/L (ref 98–111)
Creatinine, Ser: 0.95 mg/dL (ref 0.61–1.24)
GFR calc Af Amer: >60 mL/min (ref >60)
GFR calc non Af Amer: >60 mL/min (ref >60)
GLUCOSE: 86 mg/dL (ref 70–99)
POTASSIUM: 4 mmol/L (ref 3.5–5.1)
SODIUM: 141 mmol/L (ref 135–145)
Total Protein: 7.6 g/dL (ref 6.5–8.1)

## 2018-05-31 LAB — ACETAMINOPHEN LEVEL: Acetaminophen (Tylenol), Serum: <10 ug/mL — ABNORMAL LOW (ref 10–30)

## 2018-05-31 LAB — ETHANOL: Alcohol, Ethyl (B): <10 mg/dL (ref <10)

## 2018-05-31 LAB — TROPONIN I: Troponin I: <0.03 ng/mL (ref <0.03)

## 2018-05-31 MED ORDER — ACETAMINOPHEN 325 MG PO TABS: 650.0000 mg | ORAL_TABLET | Freq: Four times a day (QID) | ORAL | Status: DC | PRN

## 2018-05-31 MED ORDER — LORATADINE 10 MG PO TABS
10.0000 mg | ORAL_TABLET | Freq: Every day | ORAL | Status: DC
  Administered 2018-06-01 – 2018-06-02 (×2): 10 mg via ORAL
  Filled 2018-05-31 (×2): qty 1

## 2018-05-31 MED ORDER — FAMOTIDINE 20 MG PO TABS
40.0000 mg | ORAL_TABLET | Freq: Every evening | ORAL | Status: DC
  Administered 2018-05-31 – 2018-06-01 (×2): 40 mg via ORAL
  Filled 2018-05-31 (×2): qty 2

## 2018-05-31 MED ORDER — ALUM & MAG HYDROXIDE-SIMETH 200-200-20 MG/5ML PO SUSP: 30.0000 mL | ORAL | Status: DC | PRN

## 2018-05-31 MED ORDER — NAPROXEN 500 MG PO TABS
500.0000 mg | ORAL_TABLET | Freq: Two times a day (BID) | ORAL | Status: DC | PRN
  Filled 2018-05-31: qty 1

## 2018-05-31 MED ORDER — HYDROXYZINE HCL 25 MG PO TABS: 25.0000 mg | ORAL_TABLET | ORAL | Status: DC | PRN

## 2018-05-31 MED ORDER — MAGNESIUM HYDROXIDE 400 MG/5ML PO SUSP: 30.0000 mL | Freq: Every day | ORAL | Status: DC | PRN

## 2018-05-31 NOTE — Tx Team (Signed)
Initial Treatment Plan 05/31/2018 11:30 PM Alan Pugh VOJ:[id number]    PATIENT STRESSORS: Financial difficulties Marital or family conflict Occupational concerns   PATIENT STRENGTHS: Ability for insight Average or above average intelligence Communication skills General fund of knowledge Motivation for treatment/growth Physical Health Supportive family/friends   PATIENT IDENTIFIED PROBLEMS: Suicidal thoughts 05/31/2018  Depression 05/31/2018                   DISCHARGE CRITERIA:  Improved stabilization in mood, thinking, and/or behavior Motivation to continue treatment in a less acute level of care Need for constant or close observation no longer present Verbal commitment to aftercare and medication compliance  PRELIMINARY DISCHARGE PLAN: Outpatient therapy Return to previous living arrangement Return to previous work or school arrangements  PATIENT/FAMILY INVOLVEMENT: This treatment plan has been presented to and reviewed with the patient, Alan Pugh.  The patient has been given the opportunity to ask questions and make suggestions.  Lenox Ponds, RN 05/31/2018, 11:30 PM
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
## 2018-05-31 NOTE — ED Notes (Signed)
Hourly rounding reveals patient sleeping in room. No complaints, stable, in no acute distress. Q15 minute rounds and monitoring via Security Cameras to continue. 

## 2018-05-31 NOTE — ED Notes (Signed)
Pt presents with flat, depressed affect. Endorses SI.  When asked what triggered his OD today the patient stated, "Things have been building up."  Pt guarded.  Calm and cooperative with RN. Pt given juice.  Maintained on 15 minute checks and observation by security camera for safety.

## 2018-05-31 NOTE — ED Notes (Signed)
ED BHU PLACEMENT JUSTIFICATION Is the patient under IVC or is there intent for IVC: Yes.   Is the patient medically cleared: Yes.   Is there vacancy in the ED BHU: Yes.   Is the population mix appropriate for patient: Yes.   Is the patient awaiting placement in inpatient or outpatient setting: Yes.   Has the patient had a psychiatric consult: Yes.   Survey of unit performed for contraband, proper placement and condition of furniture, tampering with fixtures in bathroom, shower, and each patient room: Yes.  ; Findings:  APPEARANCE/BEHAVIOR Calm and cooperative NEURO ASSESSMENT Orientation: oriented x3  Denies pain Hallucinations: No.None noted (Hallucinations) Speech: Normal Gait: normal RESPIRATORY ASSESSMENT Even  Unlabored respirations  CARDIOVASCULAR ASSESSMENT Pulses equal   regular rate  Skin warm and dry   GASTROINTESTINAL ASSESSMENT no GI complaint EXTREMITIES Full ROM  PLAN OF CARE Provide calm/safe environment. Vital signs assessed twice daily. ED BHU Assessment once each 12-hour shift. Collaborate with TTS  daily or as condition indicates. Assure the ED provider has rounded once each shift. Provide and encourage hygiene. Provide redirection as needed. Assess for escalating behavior; address immediately and inform ED provider.  Assess family dynamic and appropriateness for visitation as needed: Yes.  ; If necessary, describe findings:  Educate the patient/family about BHU procedures/visitation: Yes.  ; If necessary, describe findings:   

## 2018-05-31 NOTE — ED Provider Notes (Addendum)
[facility] Emergency Department Provider Note    First MD Initiated Contact with Patient 05/31/18 1459     (approximate)  I have reviewed the triage vital signs and the nursing notes.   HISTORY  Chief Complaint Ingestion    HPI Alan Pugh is a 23 y.o. male dents to the ER for evaluation after suicide attempt with the patient ingested an entire tube of Blistex around 11 AM.  States has been feeling suicidal and depressed for the past 2 weeks.  No hallucinations.  Denies any nausea vomiting chest pain or shortness of breath.   Denies any other ingestions.  Past Medical History:  Diagnosis Date  . 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 Chicken pox   . Shingles    No family history on file. Past Surgical History:  Procedure Laterality Date  . TONSILLECTOMY     Patient Active Problem List   Diagnosis Date Noted  . Right foot pain 01/04/2015  . Right ankle pain 01/04/2015  . Need for immunization against influenza 01/04/2015      Prior to Admission medications   Medication Sig Start Date End Date Taking? Authorizing Provider  cetirizine (ZYRTEC) 10 MG tablet Take 1 tablet (10 mg total) by mouth daily. 02/16/15   Cuthriell, Delorise Royals, PA-C  famotidine (PEPCID) 40 MG tablet Take 1 tablet (40 mg total) by mouth every evening. 02/10/17 02/10/18  Rebecka Apley, MD  naproxen (NAPROSYN) 500 MG tablet Take 1 tablet (500 mg total) by mouth 2 (two) times daily with a meal. 02/14/16   Hagler, Jami L, PA-C  sucralfate (CARAFATE) 1 g tablet Take 1 tablet (1 g total) by mouth 2 (two) times daily. 02/10/17   Rebecka Apley, MD  TRIAMCINOLONE ACETONIDE, TOP, 0.05 % OINT Apply 1 application topically 4 (four) times daily. 02/16/15   Cuthriell, Delorise Royals, PA-C    Allergies Patient has no known allergies.    Social History Social History   Tobacco Use  . Smoking status: Never Smoker  . Smokeless tobacco: Never Used  Substance Use Topics    . Alcohol use: No    Alcohol/week: 0.0 standard drinks  . Drug use: No    Review of Systems Patient denies headaches, rhinorrhea, blurry vision, numbness, shortness of breath, chest pain, edema, cough, abdominal pain, nausea, vomiting, diarrhea, dysuria, fevers, rashes or hallucinations unless otherwise stated above in HPI. ____________________________________________   PHYSICAL EXAM:  VITAL SIGNS: Vitals:   05/31/18 1419  BP: (!) 150/99  Pulse: (!) 55  Resp: 18  Temp: 98.5 F (36.9 C)  SpO2: 98%    Constitutional: Alert and oriented.  Eyes: Conjunctivae are normal.  Head: Atraumatic. Nose: No congestion/rhinnorhea. Mouth/Throat: Mucous membranes are moist.   Neck: No stridor. Painless ROM.  Cardiovascular: Normal rate, regular rhythm. Grossly normal heart sounds.  Good peripheral circulation. Respiratory: Normal respiratory effort.  No retractions. Lungs CTAB. Gastrointestinal: Soft and nontender. No distention. No abdominal bruits. No CVA tenderness. Genitourinary: deferred Musculoskeletal: No lower extremity tenderness nor edema.  No joint effusions. Neurologic:  Normal speech and language. No gross focal neurologic deficits are appreciated. No facial droop Skin:  Skin is warm, dry and intact. No rash noted. Psychiatric: Mood and affect are withdrawn. Speech and behavior are normal.  ____________________________________________   LABS (all labs ordered are listed, but only abnormal results are displayed)  No results found for this or any previous visit (from the past 24 hour(s)). ____________________________________________  EKG  ED ECG REPORT I, Willy EddyPatrick Keneisha Heckart, the attending physician, personally viewed and interpreted this ECG.   Date: 06/08/2018  EKG Time: 14:33  Rate: 60  Rhythm: sinus  Axis: right  Intervals:normal intervals,   ST&T Change:  ber  ____________________________________________  RADIOLOGY   ____________________________________________   PROCEDURES  Procedure(s) performed:  Procedures    Critical Care performed: no ____________________________________________   INITIAL IMPRESSION / ASSESSMENT AND PLAN / ED COURSE  Pertinent labs & imaging results that were available during my care of the patient were reviewed by me and considered in my medical decision making (see chart for details).   DDX: Psychosis, delirium, medication effect, noncompliance, polysubstance abuse, Si, Hi, depression   Alan Pugh is a 23 y.o. who presents to the ED with for evaluation of SI.  Patient has psych history of .  Laboratory testing was ordered to evaluation for underlying electrolyte derangement or signs of underlying organic pathology to explain today's presentation.  Based on history and physical and laboratory evaluation, it appears that the patient's presentation is 2/2 underlying psychiatric disorder and will require further evaluation and management by inpatient psychiatry.  Patient was made an IVC due to attempted.  Disposition pending psychiatric evaluation.       As part of my medical decision making, I reviewed the following data within the electronic MEDICAL RECORD NUMBER Nursing notes reviewed and incorporated, Labs reviewed, notes from prior ED visits.  ____________________________________________   FINAL CLINICAL IMPRESSION(S) / ED DIAGNOSES  Final diagnoses:  Suicide attempt (HCC)      NEW MEDICATIONS STARTED DURING THIS VISIT:  New Prescriptions   No medications on file     Note:  This document was prepared using Dragon voice recognition software and may include unintentional dictation errors.    Willy Eddyobinson, Belen Pesch, MD 05/31/18 1507    Willy Eddyobinson, Lamoine Magallon, MD 06/08/18 [phone]

## 2018-05-31 NOTE — ED Notes (Signed)
Mother left contact information: Lorinda Creedamela [place] [phone]

## 2018-05-31 NOTE — ED Notes (Signed)
Patient moved to bhu2

## 2018-05-31 NOTE — ED Notes (Signed)
Pt changed into burgundy scrubs and belongings sent with mother

## 2018-05-31 NOTE — BH Assessment (Signed)
Assessment Note  Alan Pugh is an 23 y.o. male who presents to the ER due to thoughts and gestures to end his life. Patient states he recently lost his job and ended his relationship with his child's mother and now she will not allow him to see the child. Patient states he is having the feelings of been overwhelmed, hopeless, helpless and worthless. Today (05/31/2018) patient ingested "Blistex Medicated Lip Ointment" with the intent to end his life.  During the interview, the patient was calm, cooperative and pleasant. He was able to provide appropriate answers to the questions. He continues voices SI and cannot contract for safety. Patient denies HI and AV/H.   Diagnosis: Major Depression  Past Medical History:  Past Medical History:  Diagnosis Date  . 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 Chicken pox   . Shingles     Past Surgical History:  Procedure Laterality Date  . TONSILLECTOMY      Family History: No family history on file.  Social History:  reports that he has never smoked. He has never used smokeless tobacco. He reports that he does not drink alcohol or use drugs.  Additional Social History:  Alcohol / Drug Use Pain Medications: See PTA Prescriptions: See PTA Over the Counter: See PTA  CIWA: CIWA-Ar BP: (!) 150/99 Pulse Rate: (!) 55 COWS:    Allergies: No Known Allergies  Home Medications: (Not in a hospital admission)   OB/GYN Status:  No LMP for male patient.  General Assessment Data Location of Assessment: [facility] ED TTS Assessment: In system Is this a Tele or Face-to-Face Assessment?: Face-to-Face Is this an Initial Assessment or a Re-assessment for this encounter?: Initial Assessment Patient Accompanied by:: Parent Language Other than English: No Living Arrangements: Other (Comment)(Private Home) What gender do you identify as?: Male Marital status: Single Pregnancy Status: No Living Arrangements: Other (Comment) Can pt return to  current living arrangement?: Yes Admission Status: Involuntary Petitioner: ED Attending Is patient capable of signing voluntary admission?: No(Under IVC) Referral Source: Self/Family/Friend Insurance type: BCBS  Medical Screening Exam [facility] Walk-in ONLY) Medical Exam completed: Yes  Crisis Care Plan Living Arrangements: Other (Comment) Name of Psychiatrist: Reports of none Name of Therapist: Reports of none  Education Status Is patient currently in school?: No Is the patient employed, unemployed or receiving disability?: Unemployed  Risk to self with the past 6 months Suicidal Ideation: Yes-Currently Present Has patient been a risk to self within the past 6 months prior to admission? : Yes Suicidal Intent: Yes-Currently Present Has patient had any suicidal intent within the past 6 months prior to admission? : Yes Is patient at risk for suicide?: Yes Suicidal Plan?: Yes-Currently Present Has patient had any suicidal plan within the past 6 months prior to admission? : Yes Specify Current Suicidal Plan: Self harm Access to Means: Yes Previous Attempts/Gestures: No How many times?: 0 Other Self Harm Risks: Reports of none Triggers for Past Attempts: None known Intentional Self Injurious Behavior: None Family Suicide History: No Recent stressful life event(s): Other (Comment), Conflict (Comment), Loss (Comment), Financial Problems Persecutory voices/beliefs?: No Depression: Yes Depression Symptoms: Tearfulness, Isolating, Fatigue, Guilt, Loss of interest in usual pleasures, Feeling worthless/self pity Substance abuse history and/or treatment for substance abuse?: No Suicide prevention information given to non-admitted patients: Not applicable  Risk to Others within the past 6 months Homicidal Ideation: No Does patient have any lifetime risk of violence toward others beyond the six months prior to admission? : No  Thoughts of Harm to Others: No Current Homicidal Intent:  No Current Homicidal Plan: No Access to Homicidal Means: No Identified Victim: Reports of none History of harm to others?: No Assessment of Violence: None Noted Violent Behavior Description: Reports of none Does patient have access to weapons?: No Criminal Charges Pending?: No Does patient have a court date: No Is patient on probation?: No  Psychosis Hallucinations: None noted Delusions: None noted  Mental Status Report Appearance/Hygiene: Unremarkable, In scrubs Eye Contact: Fair Motor Activity: Freedom of movement, Unremarkable Speech: Logical/coherent, Unremarkable Level of Consciousness: Alert Mood: Depressed, Helpless, Sad, Pleasant Affect: Anxious, Depressed, Sad Anxiety Level: Minimal Thought Processes: Coherent, Relevant Judgement: Unimpaired Orientation: Person, Place, Time, Situation, Appropriate for developmental age Obsessive Compulsive Thoughts/Behaviors: Minimal  Cognitive Functioning Concentration: Normal Memory: Recent Intact, Remote Intact Is patient IDD: No Insight: Fair Impulse Control: Fair Appetite: Fair Have you had any weight changes? : No Change Sleep: No Change Total Hours of Sleep: 8 Vegetative Symptoms: None  ADLScreening [facility] Assessment Services) Patient's cognitive ability adequate to safely complete daily activities?: Yes Patient able to express need for assistance with ADLs?: Yes Independently performs ADLs?: Yes (appropriate for developmental age)  Prior Inpatient Therapy Prior Inpatient Therapy: No  Prior Outpatient Therapy Prior Outpatient Therapy: No Does patient have an ACCT team?: No Does patient have Intensive In-House Services?  : No Does patient have [facility] services? : No Does patient have P4CC services?: No  ADL Screening (condition at time of admission) Patient's cognitive ability adequate to safely complete daily activities?: Yes Is the patient deaf or have difficulty hearing?: No Does the patient have difficulty  seeing, even when wearing glasses/contacts?: No Does the patient have difficulty concentrating, remembering, or making decisions?: No Patient able to express need for assistance with ADLs?: Yes Does the patient have difficulty dressing or bathing?: No Independently performs ADLs?: Yes (appropriate for developmental age) Does the patient have difficulty walking or climbing stairs?: No Weakness of Legs: None Weakness of Arms/Hands: None  Home Assistive Devices/Equipment Home Assistive Devices/Equipment: None  Therapy Consults (therapy consults require a physician order) PT Evaluation Needed: No OT Evalulation Needed: No SLP Evaluation Needed: No Abuse/Neglect Assessment (Assessment to be complete while patient is alone) Abuse/Neglect Assessment Can Be Completed: Yes Physical Abuse: Denies Verbal Abuse: Denies Sexual Abuse: Denies Exploitation of patient/patient's resources: Denies Self-Neglect: Denies Values / Beliefs Cultural Requests During Hospitalization: None Spiritual Requests During Hospitalization: None Consults Spiritual Care Consult Needed: No Social Work Consult Needed: No Merchant navy officerAdvance Directives (For Healthcare) Does Patient Have a Medical Advance Directive?: No       Child/Adolescent Assessment Running Away Risk: Denies(Reports of none)  Disposition:  Disposition Initial Assessment Completed for this Encounter: Yes  On Site Evaluation by:   Reviewed with Physician:    Lilyan Gilfordalvin J. Alin Chavira MS, LCAS, LPC, NCC, CCSI Therapeutic Triage Specialist 05/31/2018 6:29 PM

## 2018-05-31 NOTE — ED Notes (Signed)
Patient states Mom took his belongings home.

## 2018-05-31 NOTE — ED Notes (Signed)
Pt given meal tray and a sprite.  

## 2018-05-31 NOTE — ED Notes (Signed)
Per poison control pt cleared medically.

## 2018-05-31 NOTE — Plan of Care (Signed)
Patient just recently admitted to the unit. Patient has not had sufficient time to show progressions at this time. Will continue to monitor for progressions.    Problem: Education: Goal: Knowledge of [facility] Education information/materials will improve Outcome: Not Progressing Goal: Emotional status will improve Outcome: Not Progressing Goal: Mental status will improve Outcome: Not Progressing Goal: Verbalization of understanding the information provided will improve Outcome: Not Progressing   Problem: Activity: Goal: Interest or engagement in activities will improve Outcome: Not Progressing Goal: Sleeping patterns will improve Outcome: Not Progressing   Problem: Coping: Goal: Ability to verbalize frustrations and anger appropriately will improve Outcome: Not Progressing Goal: Ability to demonstrate self-control will improve Outcome: Not Progressing   Problem: Health Behavior/Discharge Planning: Goal: Identification of resources available to assist in meeting health care needs will improve Outcome: Not Progressing Goal: Compliance with treatment plan for underlying cause of condition will improve Outcome: Not Progressing   Problem: Physical Regulation: Goal: Ability to maintain clinical measurements within normal limits will improve Outcome: Not Progressing   Problem: Safety: Goal: Periods of time without injury will increase Outcome: Not Progressing   Problem: Coping: Goal: Coping ability will improve Outcome: Not Progressing   Problem: Medication: Goal: Compliance with prescribed medication regimen will improve Outcome: Not Progressing   Problem: Self-Concept: Goal: Ability to disclose and discuss suicidal ideas will improve Outcome: Not Progressing Goal: Will verbalize positive feelings about self Outcome: Not Progressing   Problem: Activity: Goal: Interest or engagement in leisure activities will improve Outcome: Not Progressing Goal: Imbalance  in normal sleep/wake cycle will improve Outcome: Not Progressing   Problem: Safety: Goal: Ability to disclose and discuss suicidal ideas will improve Outcome: Not Progressing Goal: Ability to identify and utilize support systems that promote safety will improve Outcome: Not Progressing

## 2018-05-31 NOTE — ED Notes (Signed)
BEHAVIORAL HEALTH ROUNDING Patient sleeping: No. Patient alert and oriented: yes Behavior appropriate: Yes.  ; If no, describe:  Nutrition and fluids offered: yes Toileting and hygiene offered: Yes  Sitter present: q15 minute observations and security camera monitoring Law enforcement present: Yes  ODS  

## 2018-05-31 NOTE — Progress Notes (Signed)
D: Received patient from [facility] Emergency Department. Patient skin assessment completed with Trinna PostAlex, RN, skin is intact, no contraband found. Pt. Had no belongings upon admissions. Pt. Was admitted under the services of, Dr. Jennet MaduroPucilowska  Pt. During the admissions process is visibly flat in his affect and sad. Pt. Is pleasant and cooperative with processing, but forwards little and is minimal overall. Pt. Denies current suicidal and homicidal ideations. Pt. Denies avh and denies pain. Pt. Expresses some regrets about his suicide attempt, but very minimal discussion of why he is here presenting to the unit. Pt. Reports high depression, but denies anxiety. Pt. Given extensive admissions education and encouraged to comply with UA sampling needed. Pt. Verbalizes he will comply when able to for UA sampling.    A: Patient oriented to unit/room/call light. Pt. Given snack to eat. Patient was encourage to participate in unit activities and continue with plan of care being put into place. Q x 15 minute observation checks were initiated for safety.   R: Patient is receptive to treatment plan being put into place and safety to be maintained on unit.

## 2018-05-31 NOTE — ED Notes (Signed)
Pt with an observed visit from his mother -  Continue to monitor

## 2018-05-31 NOTE — BH Assessment (Signed)
Patient is to be admitted to [facility] by Dr. Viviano Simas.  Attending Physician will be Dr. Jennet Maduro.   Patient has been assigned to room 319, by [facility] Charge Nurse Lillette Boxer   ER staff is aware of the admission:  Dr. Roxan Hockey, ER MD   Amy T., Patient's Nurse   Lupita Dawn., Patient Access.

## 2018-05-31 NOTE — ED Notes (Signed)
Pt transferred into ED BHU   Patient assigned to appropriate care area. Patient oriented to unit/care area: Informed that, for his safety, care area is designed for safety and monitored by security cameras at all times; Visiting hours and phone times explained to patient. Patient verbalizes understanding, and verbal contract for safety obtained.   Assessment completed  He denies pain  No verbalized needs or concerns at this time

## 2018-05-31 NOTE — ED Notes (Signed)
Report to include Situation, Background, Assessment, and Recommendations received from Amy Teague RN. Patient alert and oriented, warm and dry, in no acute distress. Patient denies SI, HI, AVH and pain. Patient made aware of Q15 minute rounds and security cameras for their safety. Patient instructed to come to me with needs or concerns. 

## 2018-05-31 NOTE — ED Triage Notes (Signed)
Pt arrived with concerns over ingestion of of blistex medicated lip ointment at an estimated time of 1100 today. Pt states this was a suicide attempt. Pt arrives with complaints of abdominal pain and nausea.   Poison control contacted and states the most concerning ingredient is camphor which would take 30mg /kg to cause toxicity. Toxicity of camphor would cause drowsiness, abdominal pain, and seizures. Poision control recommendations: monitor pt for 4-6hours, perform EKG, maintain pt on monitor until pt becomes asymptomatic, collected toxicology urine specimen, collect bmp, and have pt evaluated by a psychologist.   PT reports for the last few weeks pt has felt depressed and suicidal. Pt states his plan was to ingest a substance in attempts to "kill himself."

## 2018-06-01 ENCOUNTER — Encounter: Payer: Self-pay | Admitting: Psychiatry

## 2018-06-01 DIAGNOSIS — F322 Major depressive disorder, single episode, severe without psychotic features: Principal | ICD-10-CM

## 2018-06-01 DIAGNOSIS — F909 Attention-deficit hyperactivity disorder, unspecified type: Secondary | ICD-10-CM | POA: Diagnosis present

## 2018-06-01 NOTE — [facility] Suicide Risk Assessment (Signed)
[facility] INPATIENT:  Family/Significant Other Suicide Prevention Education  Suicide Prevention Education:  Education Completed; Lorinda Creedamela [place], mom, [phone] has been identified by the patient as the family member/significant other with whom the patient will be residing, and identified as the person(s) who will aid the patient in the event of a mental health crisis (suicidal ideations/suicide attempt).  With written consent from the patient, the family member/significant other has been provided the following suicide prevention education, prior to the and/or following the discharge of the patient.  The suicide prevention education provided includes the following:  Suicide risk factors  Suicide prevention and interventions  National Suicide Hotline telephone number  [facility] assessment telephone number  [facility] Emergency Assistance 911  [facility] and/or Residential Mobile Crisis Unit telephone number  Request made of family/significant other to:  Remove weapons (e.g., guns, rifles, knives), all items previously/currently identified as safety concern.    Remove drugs/medications (over-the-counter, prescriptions, illicit drugs), all items previously/currently identified as a safety concern.  The family member/significant other verbalizes understanding of the suicide prevention education information provided.  The family member/significant other agrees to remove the items of safety concern listed above.  CSW spoke with mother who confirmed that she could provide transportation once discharged as well as support.    Harden MoMichaela J Cashis Rill MSW, LCSW 06/01/2018, 3:02 PM

## 2018-06-01 NOTE — [facility] Group Notes (Signed)
Feelings Around Diagnosis 06/01/2018 1PM  Type of Therapy/Topic:  Group Therapy:  Feelings about Diagnosis  Participation Level:  Active   Description of Group:   This group will allow patients to explore their thoughts and feelings about diagnoses they have received. Patients will be guided to explore their level of understanding and acceptance of these diagnoses. Facilitator will encourage patients to process their thoughts and feelings about the reactions of others to their diagnosis and will guide patients in identifying ways to discuss their diagnosis with significant others in their lives. This group will be process-oriented, with patients participating in exploration of their own experiences, giving and receiving support, and processing challenge from other group members.   Therapeutic Goals: 1. Patient will demonstrate understanding of diagnosis as evidenced by identifying two or more symptoms of the disorder 2. Patient will be able to express two feelings regarding the diagnosis 3. Patient will demonstrate their ability to communicate their needs through discussion and/or role play  Summary of Patient Progress:  Actively and appropriately engaged in the group. Patient was able to provide support and validation to other group members.Patient practiced active listening when interacting with the facilitator and other group members. Patient respected boundaries and expressed his emotions appropriately during session.     Therapeutic Modalities:   Cognitive Behavioral Therapy Brief Therapy Feelings Identification    Suzan Slick, LCSW 06/01/2018 2:07 PM
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
## 2018-06-01 NOTE — Progress Notes (Signed)
Recreation Therapy Notes  Date:06/01/2018  Time:9:30 am  Location:Craft room  Behavioral response:N/A  Intervention Topic: Goals  Discussion/Intervention: Patient did not attend group.  Clinical Observations/Feedback:  Patient did not attend group.  Corri Delapaz LRT/CTRS        Hilding Quintanar 06/01/2018 10:44 AM

## 2018-06-01 NOTE — Progress Notes (Signed)
D - Patient was in the day room upon arrival to the unit. Patient was pleasant during assessment. Patient denies SI/HI/AVH, pain, anxiety and depression. Patient was observed interacting appropriately with staff and peers on the unit by this Clinical research associate. Patient stated, "I made a mistake, I am not going to act like this any more when I get out of here."   A - Patient wasn't scheduled any medication but was compliant with procedures on the unit. Patient given education. Patient given support and encouragement. Patient informed to let staff know if there are any issues or problems on the unit.   R - Patient being monitored Q 15 minutes for safety per unit protocol. Patient remains safe at this time.

## 2018-06-01 NOTE — Plan of Care (Signed)
Patient was compliant with procedures on the unit and was observed interacting appropriately with staff and peers on the unit. Patient stated he wants to work on his coping skills while he is here.   Problem: Health Behavior/Discharge Planning: Goal: Compliance with treatment plan for underlying cause of condition will improve Outcome: Progressing   Problem: Coping: Goal: Coping ability will improve Outcome: Progressing
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
## 2018-06-01 NOTE — Plan of Care (Signed)
Patient is alert and oriented x 4, denies SI, HI and AVH. Patient is flat, sadden about events in life. Patient isolates to room most of the time, however patient did attend social work group today. Patient takes medication with out any issues. No self harming behaving observed. Safety checks Q 15 minutes. Problem: Health Behavior/Discharge Planning: Goal: Identification of resources available to assist in meeting health care needs will improve Outcome: Progressing Goal: Compliance with treatment plan for underlying cause of condition will improve Outcome: Progressing   Problem: Physical Regulation: Goal: Ability to maintain clinical measurements within normal limits will improve Outcome: Progressing   Problem: Safety: Goal: Periods of time without injury will increase Outcome: Progressing   Problem: Coping: Goal: Coping ability will improve Outcome: Progressing   Problem: Medication: Goal: Compliance with prescribed medication regimen will improve Outcome: Progressing

## 2018-06-01 NOTE — BHH Suicide Risk Assessment (Signed)
[facility] Admission Suicide Risk Assessment   Nursing information obtained from:  Patient, Review of record Demographic factors:  Male, Adolescent or young adult, Unemployed Current Mental Status:  NA(Denies) Loss Factors:  Financial problems / change in socioeconomic status, Loss of significant relationship Historical Factors:  Prior suicide attempts Risk Reduction Factors:  Living with another person, especially a relative, Positive social support, Positive therapeutic relationship  Total Time spent with patient: 1 hour Principal Problem: Major depressive disorder, single episode, severe without psychosis (HCC) Diagnosis:  Principal Problem:   Major depressive disorder, single episode, severe without psychosis (HCC) Active Problems:   Suicide attempt by inadequate means (HCC)   Attention deficit hyperactivity disorder (ADHD)  Subjective Data: suicide attempt  Continued Clinical Symptoms:  Alcohol Use Disorder Identification Test Final Score (AUDIT): 0 The "Alcohol Use Disorders Identification Test", Guidelines for Use in Primary Care, Second Edition.  World Science writer [facility]). Score between 0-7:  no or low risk or alcohol related problems. Score between 8-15:  moderate risk of alcohol related problems. Score between 16-19:  high risk of alcohol related problems. Score 20 or above:  warrants further diagnostic evaluation for alcohol dependence and treatment.   CLINICAL FACTORS:   Depression:   Impulsivity   Musculoskeletal: Strength & Muscle Tone: within normal limits Gait & Station: normal Patient leans: N/A  Psychiatric Specialty Exam: Physical Exam  Nursing note and vitals reviewed. Psychiatric: His affect is blunt. His speech is delayed. He is slowed and withdrawn. Cognition and memory are normal. He expresses impulsivity. He exhibits a depressed mood. He expresses suicidal ideation.    Review of Systems  Neurological: Negative.   Psychiatric/Behavioral: Positive  for depression and suicidal ideas.  All other systems reviewed and are negative.   Blood pressure 130/83, pulse 74, temperature 98.1 F (36.7 C), temperature source Oral, resp. rate 18, height 5\' 10"  (1.778 m), weight 77.1 kg, SpO2 100 %.Body mass index is 24.39 kg/m.  General Appearance: Casual  Eye Contact:  Good  Speech:  Slow  Volume:  Decreased  Mood:  Depressed and Hopeless  Affect:  Flat  Thought Process:  Goal Directed and Descriptions of Associations: Intact  Orientation:  Full (Time, Place, and Person)  Thought Content:  WDL  Suicidal Thoughts:  Yes.  without intent/plan  Homicidal Thoughts:  No  Memory:  Immediate;   Fair Recent;   Fair Remote;   Fair  Judgement:  Impaired  Insight:  Shallow  Psychomotor Activity:  Decreased  Concentration:  Concentration: Fair and Attention Span: Fair  Recall:  [employer] of Knowledge:  Fair  Language:  Fair  Akathisia:  No  Handed:  Right  AIMS (if indicated):     Assets:  Communication Skills Desire for Improvement Housing Physical Health Resilience Social Support Vocational/Educational  ADL's:  Intact  Cognition:  WNL  Sleep:  Number of Hours: 5.15      COGNITIVE FEATURES THAT CONTRIBUTE TO RISK:  None    SUICIDE RISK:   Moderate:  Frequent suicidal ideation with limited intensity, and duration, some specificity in terms of plans, no associated intent, good self-control, limited dysphoria/symptomatology, some risk factors present, and identifiable protective factors, including available and accessible social support.  PLAN OF CARE: hospital admission, medication management, discharge planning.  Alan Pugh is a 23 year old male with a history of ADHD admitted after suicide attempt in the context of severe social stressor (lost car, job and contact with 11-month-old daughter all in three weeks).  #Suicidal ideation, still suicidal -  patient able to contact for safety in the hospital  #Mood -patient not interested in  pharmacotherapy -open to therapy  #ADHD -stopped medication at 16 and no longer interested  #Disposition -discharge to home with family -follow up with [facility]  I certify that inpatient services furnished can reasonably be expected to improve the patient's condition.   Alan Linea, MD 06/01/2018, 9:49 AM
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
## 2018-06-02 NOTE — BHH Suicide Risk Assessment (Signed)
[facility] Discharge Suicide Risk Assessment   Principal Problem: Major depressive disorder, single episode, severe without psychosis (HCC) Discharge Diagnoses: Principal Problem:   Major depressive disorder, single episode, severe without psychosis (HCC) Active Problems:   Suicide attempt by inadequate means (HCC)   Attention deficit hyperactivity disorder (ADHD)   Total Time spent with patient: 20 minutes  Musculoskeletal: Strength & Muscle Tone: within normal limits Gait & Station: normal Patient leans: N/A  Psychiatric Specialty Exam: Review of Systems  Neurological: Negative.   Psychiatric/Behavioral: Negative.   All other systems reviewed and are negative.   Blood pressure 130/83, pulse 74, temperature 98.1 F (36.7 C), temperature source Oral, resp. rate 18, height 5\' 10"  (1.778 m), weight 77.1 kg, SpO2 100 %.Body mass index is 24.39 kg/m.  General Appearance: Casual  Eye Contact::  Good  Speech:  Clear and Coherent409  Volume:  Normal  Mood:  Anxious  Affect:  Appropriate  Thought Process:  Goal Directed and Descriptions of Associations: Intact  Orientation:  Full (Time, Place, and Person)  Thought Content:  WDL  Suicidal Thoughts:  No  Homicidal Thoughts:  No  Memory:  Immediate;   Fair Recent;   Fair Remote;   Fair  Judgement:  Impaired  Insight:  Shallow  Psychomotor Activity:  Normal  Concentration:  Fair  Recall:  [employer] of Knowledge:Fair  Language: Fair  Akathisia:  No  Handed:  Right  AIMS (if indicated):     Assets:  Communication Skills Desire for Improvement Financial Resources/Insurance Housing Physical Health Resilience Social Support Vocational/Educational  Sleep:  Number of Hours: 7  Cognition: WNL  ADL's:  Intact   Mental Status Per Nursing Assessment::   On Admission:  NA(Denies)  Demographic Factors:  Male and Unemployed  Loss Factors: Decrease in vocational status, Loss of significant relationship and Financial  problems/change in socioeconomic status  Historical Factors: Impulsivity  Risk Reduction Factors:   Responsible for children under 95 years of age, Sense of responsibility to family, Living with another person, especially a relative and Positive social support  Continued Clinical Symptoms:  Depression:   Impulsivity Recent sense of peace/wellbeing  Cognitive Features That Contribute To Risk:  None    Suicide Risk:  Minimal: No identifiable suicidal ideation.  Patients presenting with no risk factors but with morbid ruminations; may be classified as minimal risk based on the severity of the depressive symptoms  Follow-up Information    [facility] Follow up on 06/07/2018.   Why:  Please call to confirm appointment.  You are scheduled to meet with Lorella Nimrod for Peer Support Services on Monday 1/20 at 7:15AM Contact information: [address] [place] [address] [phone]           Plan Of Care/Follow-up recommendations:  Activity:  as tolerated Diet:  regular Other:  keep follow up appointments  Kristine Linea, MD 06/02/2018, 9:45 AM

## 2018-06-02 NOTE — Tx Team (Signed)
Interdisciplinary Treatment and Diagnostic Plan Update  06/02/2018 Time of Session: 10:30AM Alan Pugh MRN: [id number]  Principal Diagnosis: Major depressive disorder, single episode, severe without psychosis (HCC)  Secondary Diagnoses: Principal Problem:   Major depressive disorder, single episode, severe without psychosis (HCC) Active Problems:   Suicide attempt by inadequate means [facility])   Attention deficit hyperactivity disorder (ADHD)   Current Medications:  Current Facility-Administered Medications  Medication Dose Route Frequency Provider Last Rate Last Dose  . acetaminophen (TYLENOL) tablet 650 mg  650 mg Oral Q6H PRN Mariel Craft, MD      . alum & mag hydroxide-simeth (MAALOX/MYLANTA) 200-200-20 MG/5ML suspension 30 mL  30 mL Oral Q4H PRN Mariel Craft, MD      . famotidine (PEPCID) tablet 40 mg  40 mg Oral QPM Mariel Craft, MD   40 mg at 06/01/18 1743  . hydrOXYzine (ATARAX/VISTARIL) tablet 25 mg  25 mg Oral Q4H PRN Mariel Craft, MD      . loratadine (CLARITIN) tablet 10 mg  10 mg Oral Daily Mariel Craft, MD   10 mg at 06/02/18 0810  . magnesium hydroxide (MILK OF MAGNESIA) suspension 30 mL  30 mL Oral Daily PRN Mariel Craft, MD      . naproxen (NAPROSYN) tablet 500 mg  500 mg Oral BID PRN Mariel Craft, MD       PTA Medications: No medications prior to admission.    Patient Stressors: Financial difficulties Marital or family conflict Occupational concerns  Patient Strengths: Ability for insight Average or above average intelligence Communication skills General fund of knowledge Motivation for treatment/growth Physical Health Supportive family/friends  Treatment Modalities: Medication Management, Group therapy, Case management,  1 to 1 session with clinician, Psychoeducation, Recreational therapy.   Physician Treatment Plan for Primary Diagnosis: Major depressive disorder, single episode, severe without psychosis (HCC) Long Term  Goal(s): Improvement in symptoms so as ready for discharge Improvement in symptoms so as ready for discharge   Short Term Goals: Ability to identify changes in lifestyle to reduce recurrence of condition will improve Ability to verbalize feelings will improve Ability to disclose and discuss suicidal ideas Ability to demonstrate self-control will improve Ability to identify and develop effective coping behaviors will improve Ability to maintain clinical measurements within normal limits will improve Ability to identify triggers associated with substance abuse/mental health issues will improve NA  Medication Management: Evaluate patient's response, side effects, and tolerance of medication regimen.  Therapeutic Interventions: 1 to 1 sessions, Unit Group sessions and Medication administration.  Evaluation of Outcomes: Adequate for Discharge  Physician Treatment Plan for Secondary Diagnosis: Principal Problem:   Major depressive disorder, single episode, severe without psychosis (HCC) Active Problems:   Suicide attempt by inadequate means (HCC)   Attention deficit hyperactivity disorder (ADHD)  Long Term Goal(s): Improvement in symptoms so as ready for discharge Improvement in symptoms so as ready for discharge   Short Term Goals: Ability to identify changes in lifestyle to reduce recurrence of condition will improve Ability to verbalize feelings will improve Ability to disclose and discuss suicidal ideas Ability to demonstrate self-control will improve Ability to identify and develop effective coping behaviors will improve Ability to maintain clinical measurements within normal limits will improve Ability to identify triggers associated with substance abuse/mental health issues will improve NA     Medication Management: Evaluate patient's response, side effects, and tolerance of medication regimen.  Therapeutic Interventions: 1 to 1 sessions, Unit Group sessions and Medication  administration.  Evaluation of Outcomes: Adequate for Discharge   RN Treatment Plan for Primary Diagnosis: Major depressive disorder, single episode, severe without psychosis (HCC) Long Term Goal(s): Knowledge of disease and therapeutic regimen to maintain health will improve  Short Term Goals: Ability to participate in decision making will improve, Ability to verbalize feelings will improve, Ability to disclose and discuss suicidal ideas and Ability to identify and develop effective coping behaviors will improve  Medication Management: RN will administer medications as ordered by provider, will assess and evaluate patient's response and provide education to patient for prescribed medication. RN will report any adverse and/or side effects to prescribing provider.  Therapeutic Interventions: 1 on 1 counseling sessions, Psychoeducation, Medication administration, Evaluate responses to treatment, Monitor vital signs and CBGs as ordered, Perform/monitor CIWA, COWS, AIMS and Fall Risk screenings as ordered, Perform wound care treatments as ordered.  Evaluation of Outcomes: Adequate for Discharge   LCSW Treatment Plan for Primary Diagnosis: Major depressive disorder, single episode, severe without psychosis (HCC) Long Term Goal(s): Safe transition to appropriate next level of care at discharge, Engage patient in therapeutic group addressing interpersonal concerns.  Short Term Goals: Engage patient in aftercare planning with referrals and resources, Increase social support, Increase ability to appropriately verbalize feelings, Increase emotional regulation and Increase skills for wellness and recovery  Therapeutic Interventions: Assess for all discharge needs, 1 to 1 time with Social worker, Explore available resources and support systems, Assess for adequacy in community support network, Educate family and significant other(s) on suicide prevention, Complete Psychosocial Assessment, Interpersonal  group therapy.  Evaluation of Outcomes: Adequate for Discharge   Progress in Treatment: Attending groups: Yes. Participating in groups: Yes. Taking medication as prescribed: Yes. Toleration medication: Yes. Family/Significant other contact made: Yes, individual(s) contacted:  CSW spoke with patients mother. Patient understands diagnosis: Yes. Discussing patient identified problems/goals with staff: Yes. Medical problems stabilized or resolved: Yes. Denies suicidal/homicidal ideation: Yes. Issues/concerns per patient self-inventory: No. Other: none  New problem(s) identified: No, Describe:  none  New Short Term/Long Term Goal(s):  Patient Goals:  "to get better"  Discharge Plan or Barriers:   Reason for Continuation of Hospitalization: Anxiety Depression  Estimated Length of Stay:  Attendees: Patient: Alan Pugh 06/02/2018 11:56 AM  Physician: Dr. Jennet MaduroPucilowska, MD 06/02/2018 11:56 AM  Nursing:  06/02/2018 11:56 AM  RN Care Manager: 06/02/2018 11:56 AM  Social Worker: Alan HomansMichaela Afshin Chrystal, LCSW 06/02/2018 11:56 AM  Recreational Therapist: Garret ReddishShay Pugh, CTRS, LRT 06/02/2018 11:56 AM  Other:  06/02/2018 11:56 AM  Other:  06/02/2018 11:56 AM  Other: 06/02/2018 11:56 AM    Scribe for Treatment Team: Alan MoMichaela J Marisha Renier, LCSW 06/02/2018 11:56 AM

## 2018-06-02 NOTE — Progress Notes (Signed)
  [facility] Adult Case Management Discharge Plan :  Will you be returning to the same living situation after discharge:  Yes,  pt is returning home. At discharge, do you have transportation home?: Yes,  mother will provide transportation. Do you have the ability to pay for your medications: Yes,  pt has [employer]  Release of information consent forms completed and in the chart;  Patient's signature needed at discharge.  Patient to Follow up at: Follow-up Information    [facility] Follow up on 06/07/2018.   Why:  Please call to confirm appointment.  You are scheduled to meet with Lorella Nimrod for Peer Support Services on Monday 1/20 at 7:15AM Contact information: [address] [place] [address] [phone]           Next level of care provider has access to [facility] Link:no  Safety Planning and Suicide Prevention discussed: Yes,  CSW spoke with patients mother.  Have you used any form of tobacco in the last 30 days? (Cigarettes, Smokeless Tobacco, Cigars, and/or Pipes): No  Has patient been referred to the Quitline?: N/A patient is not a smoker  Patient has been referred for addiction treatment: N/A  Harden Mo, LCSW 06/02/2018, 9:16 AM
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
## 2019-07-08 ENCOUNTER — Ambulatory Visit: Payer: BC Managed Care – PPO | Admitting: Physician Assistant

## 2019-07-08 ENCOUNTER — Other Ambulatory Visit: Payer: Self-pay

## 2019-07-08 DIAGNOSIS — A5401 Gonococcal cystitis and urethritis, unspecified: Secondary | ICD-10-CM

## 2019-07-08 DIAGNOSIS — Z113 Encounter for screening for infections with a predominantly sexual mode of transmission: Secondary | ICD-10-CM

## 2019-07-08 LAB — GRAM STAIN

## 2019-07-08 MED ORDER — CEFTRIAXONE SODIUM 250 MG IJ SOLR
500.0000 mg | Freq: Once | INTRAMUSCULAR | Status: AC
  Administered 2019-07-08: 500 mg via INTRAMUSCULAR

## 2019-07-08 MED ORDER — DOXYCYCLINE HYCLATE 100 MG PO TABS: 100.0000 mg | ORAL_TABLET | Freq: Two times a day (BID) | ORAL | 0 refills | Status: AC

## 2019-07-10 ENCOUNTER — Encounter: Payer: Self-pay | Admitting: Physician Assistant

## 2019-07-10 NOTE — Progress Notes (Signed)
[facility] Department STI clinic/screening visit  Subjective:  Alan Pugh is a 24 y.o. male being seen today for an STI screening visit. The patient reports they do have symptoms.    Patient has the following medical conditions:   Patient Active Problem List   Diagnosis Date Noted  . Attention deficit hyperactivity disorder (ADHD) 06/01/2018  . Suicide attempt by inadequate means ([place]) 05/31/2018  . Major depressive disorder, single episode, severe without psychosis ([place]) 05/31/2018  . Right foot pain 01/04/2015  . Right ankle pain 01/04/2015  . Need for immunization against influenza 01/04/2015     Chief Complaint  Patient presents with  . SEXUALLY TRANSMITTED DISEASE    Screening     HPI  Patient reports that he has had greenish discharge and dysuria for 2 days.  Denies other symptoms.   Pugh flowsheet for further details and programmatic requirements.    The following portions of the patient's history were reviewed and updated as appropriate: allergies, current medications, past medical history, past social history, past surgical history and problem list.  Objective:  There were no vitals filed for this visit.  Physical Exam Constitutional:      General: He is not in acute distress.    Appearance: He is normal weight.  HENT:     Head: Normocephalic and atraumatic.     Comments: No nits, lice, or hair loss. No cervical, supraclavicular or axillary adenopathy.    Mouth/Throat:     Mouth: Mucous membranes are moist.     Pharynx: Oropharynx is clear. No oropharyngeal exudate or posterior oropharyngeal erythema.  Eyes:     Conjunctiva/sclera: Conjunctivae normal.  Pulmonary:     Effort: Pulmonary effort is normal.  Abdominal:     Palpations: Abdomen is soft. There is no mass.     Tenderness: There is no abdominal tenderness. There is no guarding or rebound.  Genitourinary:    Penis: Normal.      Testes: Normal.     Comments: Pubic area  without nits, lice, edema, erythema, and lesions. Shotty inguinal adenopathy bilaterally. Penis circumcised, without rash or lesions.  Moderate amount of green discharge at meatus. Musculoskeletal:     Cervical back: Neck supple. No tenderness.  Skin:    General: Skin is warm and dry.     Findings: No bruising, erythema, lesion or rash.  Neurological:     Mental Status: He is alert and oriented to person, place, and time.  Psychiatric:        Mood and Affect: Mood normal.        Behavior: Behavior normal.        Thought Content: Thought content normal.        Judgment: Judgment normal.       Assessment and Plan:  Alan Pugh is a 24 y.o. male presenting to the [facility] Department for STI screening  1. Screening for STD (sexually transmitted disease) Patient into clinic with symptoms. Rec condoms with all sex. Await test results.  Counseled that RN will call if needs to RTC for further treatment once results are back.  - Gram stain - HIV [place] [facility] - Syphilis Serology,  Lab  2. Gonococcal urethritis in male Treat for GC with Ceftriaxone 500mg  and Doxycycline 100mg  #14 1 po BID for 7days. No sex for 7 days and until after partner completes treatment. Call if has questions or concerns about medicine SE. - cefTRIAXone (ROCEPHIN) injection 500 mg - doxycycline (  VIBRA-TABS) 100 MG tablet; Take 1 tablet (100 mg total) by mouth 2 (two) times daily for 7 days.  Dispense: 14 tablet; Refill: 0     No follow-ups on file.  No future appointments.  Matt Holmes, PA

## 2019-07-11 NOTE — Progress Notes (Signed)
Patient tx'd per [facility] Campbell, RN

## 2020-02-06 ENCOUNTER — Ambulatory Visit: Payer: Self-pay | Admitting: Physician Assistant

## 2020-02-06 ENCOUNTER — Encounter: Payer: Self-pay | Admitting: Physician Assistant

## 2020-02-06 DIAGNOSIS — Z202 Contact with and (suspected) exposure to infections with a predominantly sexual mode of transmission: Secondary | ICD-10-CM

## 2020-02-06 DIAGNOSIS — Z113 Encounter for screening for infections with a predominantly sexual mode of transmission: Secondary | ICD-10-CM

## 2020-02-06 MED ORDER — DOXYCYCLINE HYCLATE 100 MG PO TABS: 100.0000 mg | ORAL_TABLET | Freq: Two times a day (BID) | ORAL | 0 refills | Status: AC

## 2020-02-06 NOTE — Progress Notes (Signed)
   [facility] Department STI clinic/screening visit  Subjective:  Alan Pugh is a 24 y.o. male being seen today for an STI screening visit. The patient reports they do not have symptoms.    Patient has the following medical conditions:   Patient Active Problem List   Diagnosis Date Noted  . Attention deficit hyperactivity disorder (ADHD) 06/01/2018  . Suicide attempt by inadequate means ([facility]) 05/31/2018  . Major depressive disorder, single episode, severe without psychosis ([facility]) 05/31/2018  . Right foot pain 01/04/2015  . Right ankle pain 01/04/2015  . Need for immunization against influenza 01/04/2015     Chief Complaint  Patient presents with  . SEXUALLY TRANSMITTED DISEASE    screening    HPI  Patient reports that he is not having any symptoms but is a contact to Chlamydia.  Denies chronic conditions.  Reports that his last HIV test was in 06/2019.     See flowsheet for further details and programmatic requirements.    The following portions of the patient's history were reviewed and updated as appropriate: allergies, current medications, past medical history, past social history, past surgical history and problem list.  Objective:  There were no vitals filed for this visit.  Physical Exam Constitutional:      General: He is not in acute distress.    Appearance: Normal appearance.  HENT:     Head: Normocephalic and atraumatic.  Eyes:     Conjunctiva/sclera: Conjunctivae normal.  Pulmonary:     Effort: Pulmonary effort is normal.  Neurological:     Mental Status: He is alert and oriented to person, place, and time.  Psychiatric:        Mood and Affect: Mood normal.        Behavior: Behavior normal.        Thought Content: Thought content normal.        Judgment: Judgment normal.       Assessment and Plan:  Alan Pugh is a 24 y.o. male presenting to the [facility] Department for STI screening  1. Screening for STD  (sexually transmitted disease) Patient into clinic without symptoms. Patient declines screening exam for Gram stain and GC culture. Rec condoms with all sex. Await test results.  Counseled that RN will call if needs to RTC for treatment once results are back. - HIV [place] [facility] - Syphilis Serology, [place] [facility]  2. Chlamydia contact Treat as a contact to Chlamydia with Doxycycline 100 mg #14 1 po BID for 7 days  No sex for 7 days and until after partner completes treatment. Call with questions or concerns. - doxycycline (VIBRA-TABS) 100 MG tablet; Take 1 tablet (100 mg total) by mouth 2 (two) times daily for 7 days.  Dispense: 14 tablet; Refill: 0     No follow-ups on file.  No future appointments.  Matt Holmes, PA

## 2020-02-06 NOTE — Progress Notes (Signed)
Pt treated per provider orders for contact to chlamydia. Provider orders completed.

## 2020-07-21 ENCOUNTER — Emergency Department
Admission: EM | Admit: 2020-07-21 | Discharge: 2020-07-21 | Disposition: A | Discharge status: Home / Self Care | Payer: BLUE CROSS/BLUE SHIELD | Attending: Emergency Medicine | Admitting: Emergency Medicine

## 2020-07-21 ENCOUNTER — Encounter: Payer: Self-pay | Admitting: Emergency Medicine

## 2020-07-21 ENCOUNTER — Other Ambulatory Visit: Payer: Self-pay

## 2020-07-21 DIAGNOSIS — K0889 Other specified disorders of teeth and supporting structures: Secondary | ICD-10-CM

## 2020-07-21 MED ORDER — AMOXICILLIN-POT CLAVULANATE 875-125 MG PO TABS
1.0000 | ORAL_TABLET | Freq: Once | ORAL | Status: AC
  Administered 2020-07-21: 1 via ORAL
  Filled 2020-07-21: qty 1

## 2020-07-21 MED ORDER — KETOROLAC TROMETHAMINE 30 MG/ML IJ SOLN
30.0000 mg | Freq: Once | INTRAMUSCULAR | Status: DC
  Filled 2020-07-21: qty 1

## 2020-07-21 MED ORDER — KETOROLAC TROMETHAMINE 30 MG/ML IJ SOLN
30.0000 mg | Freq: Once | INTRAMUSCULAR | Status: AC
  Administered 2020-07-21: 30 mg via INTRAMUSCULAR

## 2020-07-21 MED ORDER — AMOXICILLIN-POT CLAVULANATE 875-125 MG PO TABS: 1.0000 | ORAL_TABLET | Freq: Two times a day (BID) | ORAL | 0 refills | Status: AC

## 2020-07-21 NOTE — Discharge Instructions (Addendum)
You were seen today for dental pain.  You likely have an abscessed tooth.  You received a dose of anti-inflammatory and antibiotic in the ER.  I have given you prescription for antibiotics to take twice daily for the next 10 days.  It may be helpful if you gargle with salt water twice daily.  You may take ibuprofen 800 mg every 8 hours as needed for pain and inflammation alternating with Tylenol 1000 mg every 8 hours.  Please follow-up with the [facility] if symptoms persist or worsen

## 2020-07-21 NOTE — ED Provider Notes (Signed)
[facility] Emergency Department Provider Note ____________________________________________  Time seen: 1630  I have reviewed the triage vital signs and the nursing notes.  HISTORY  Chief Complaint  Dental Pain   HPI Alan Pugh is a 25 y.o. male presents to the ER today for right upper tooth pain.  He reports this started yesterday.  He describes the pain as throbbing.  He is having difficulty chewing because of the pain.  He denies runny nose, nasal congestion, ear pain or sore throat.  He denies fever, chills or body aches.  He reports he does have dental issues but has no dental insurance and has been unable to see a dentist.  He has not taken anything OTC for his symptoms.  Past Medical History:  Diagnosis Date  . 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 Chicken pox   . Shingles     Patient Active Problem List   Diagnosis Date Noted  . Attention deficit hyperactivity disorder (ADHD) 06/01/2018  . Suicide attempt by inadequate means ([facility]) 05/31/2018  . Major depressive disorder, single episode, severe without psychosis ([facility]) 05/31/2018  . Right foot pain 01/04/2015  . Right ankle pain 01/04/2015  . Need for immunization against influenza 01/04/2015    Past Surgical History:  Procedure Laterality Date  . TONSILLECTOMY      Prior to Admission medications   Medication Sig Start Date End Date Taking? Authorizing Provider  amoxicillin-clavulanate (AUGMENTIN) 875-125 MG tablet Take 1 tablet by mouth every 12 (twelve) hours for 10 days. 07/21/20 07/31/20 Yes BaitySalvadore Oxford, NP    Allergies Patient has no known allergies.  No family history on file.  Social History Social History   Tobacco Use  . Smoking status: Never Smoker  . Smokeless tobacco: Never Used  Substance Use Topics  . Alcohol use: No    Alcohol/week: 0.0 standard drinks  . Drug use: No    Review of Systems  Constitutional: Negative for fever, chills or body  aches. ENT: Positive for tooth pain. Cardiovascular: Negative for chest pain or chest tightness. Respiratory: Negative for cough or shortness of breath. Skin: Negative for redness or swelling. Neurological: Negative for headaches, focal weakness, tingling or numbness. ____________________________________________  PHYSICAL EXAM:  VITAL SIGNS: ED Triage Vitals  Enc Vitals Group     BP 07/21/20 1410 108/71     Pulse Rate 07/21/20 1410 72     Resp 07/21/20 1410 16     Temp 07/21/20 1410 99.2 F (37.3 C)     Temp Source 07/21/20 1410 Oral     SpO2 07/21/20 1410 98 %     Weight 07/21/20 1409 169 lb 12.1 oz (77 kg)     Height 07/21/20 1409 5\' 11"  (1.803 m)     Head Circumference --      Peak Flow --      Pain Score 07/21/20 1409 8     Pain Loc --      Pain Edu? --      Excl. in GC? --     Constitutional: Alert and oriented.  Appears in pain but in no distress. Head: Normocephalic. Eyes: Normal extraocular movements Mouth/Throat: Pt points to tooth number 5 as his source of pain. Reports it is cracked. He does have some gingival erythema but no obvious abscess or facial swelling .Mucous membranes are moist. Hematological/Lymphatic/Immunological: No cervical lymphadenopathy. Cardiovascular: Normal rate, regular rhythm.  Respiratory: Normal respiratory effort. No wheezes/rales/rhonchi. Neurologic:  Normal gait without ataxia.  Normal speech and language. No gross focal neurologic deficits are appreciated. Skin:  Skin is warm, dry and intact. No rash noted.   INITIAL IMPRESSION / ASSESSMENT AND PLAN / ED COURSE  Dental Pain:  Likely early abscess Toradol 30 mg IM x 1 Augmentin 875-125 mg PO x 1 RX for Augmentin 875-125 mg PO BID x 10 days Encouraged salt water gargles Follow up with dentist as an outpatient ____________________________________________  FINAL CLINICAL IMPRESSION(S) / ED DIAGNOSES  Final diagnoses:  Pain, dental      Lorre Munroe, NP 07/21/20  1645    Jene Every, MD 07/22/20 1524

## 2020-07-21 NOTE — ED Notes (Addendum)
Patient provided ice pack for pain to the right jaw.

## 2020-07-21 NOTE — ED Triage Notes (Signed)
Pt reports toothache to right lower jaw since yesterday.

## 2021-09-29 ENCOUNTER — Encounter: Payer: Self-pay | Admitting: Emergency Medicine

## 2021-09-29 ENCOUNTER — Emergency Department
Admission: EM | Admit: 2021-09-29 | Discharge: 2021-09-29 | Disposition: A | Discharge status: Home / Self Care | Payer: Self-pay | Attending: Emergency Medicine | Admitting: Emergency Medicine

## 2021-09-29 ENCOUNTER — Other Ambulatory Visit: Payer: Self-pay

## 2021-09-29 DIAGNOSIS — S0081XA Abrasion of other part of head, initial encounter: Secondary | ICD-10-CM | POA: Insufficient documentation

## 2021-09-29 DIAGNOSIS — T07XXXA Unspecified multiple injuries, initial encounter: Secondary | ICD-10-CM

## 2021-09-29 DIAGNOSIS — S80211A Abrasion, right knee, initial encounter: Secondary | ICD-10-CM | POA: Insufficient documentation

## 2021-09-29 DIAGNOSIS — S20411A Abrasion of right back wall of thorax, initial encounter: Secondary | ICD-10-CM | POA: Insufficient documentation

## 2021-09-29 DIAGNOSIS — Z23 Encounter for immunization: Secondary | ICD-10-CM | POA: Insufficient documentation

## 2021-09-29 MED ORDER — TETANUS-DIPHTH-ACELL PERTUSSIS 5-2.5-18.5 LF-MCG/0.5 IM SUSY
0.5000 mL | PREFILLED_SYRINGE | Freq: Once | INTRAMUSCULAR | Status: AC
  Administered 2021-09-29: 0.5 mL via INTRAMUSCULAR
  Filled 2021-09-29: qty 0.5
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
## 2021-09-29 NOTE — ED Notes (Signed)
Discharged delayed due to arranging transport for pt.  ?
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
## 2021-09-29 NOTE — Discharge Instructions (Signed)
Call make an appointment with your primary care provider for evaluation of any areas that you believe might be getting infected.  You may also follow-up with urgent care if needed.  Your tetanus booster was updated today.  You may take Tylenol as needed for muscle aches.  Keep these areas clean and dry.  Clean daily with mild soap and water and allowed to dry completely. ?
